# Patient Record
Sex: Female | Born: 1950 | Race: White | Hispanic: No | Smoking: Never smoker
Health system: Southern US, Community
[De-identification: ages and names within clinical notes are randomized; demographics above are authoritative.]

## PROBLEM LIST (undated history)

## (undated) DIAGNOSIS — D492 Neoplasm of unspecified behavior of bone, soft tissue, and skin: Secondary | ICD-10-CM

## (undated) DIAGNOSIS — C4499 Other specified malignant neoplasm of skin, unspecified: Secondary | ICD-10-CM

## (undated) DIAGNOSIS — B029 Zoster without complications: Secondary | ICD-10-CM

## (undated) DIAGNOSIS — D249 Benign neoplasm of unspecified breast: Secondary | ICD-10-CM

## (undated) DIAGNOSIS — F419 Anxiety disorder, unspecified: Secondary | ICD-10-CM

## (undated) DIAGNOSIS — R200 Anesthesia of skin: Secondary | ICD-10-CM

## (undated) DIAGNOSIS — K5790 Diverticulosis of intestine, part unspecified, without perforation or abscess without bleeding: Secondary | ICD-10-CM

## (undated) DIAGNOSIS — I10 Essential (primary) hypertension: Secondary | ICD-10-CM

## (undated) HISTORY — PX: HERNIA REPAIR: SHX51

## (undated) HISTORY — DX: Zoster without complications: B02.9

## (undated) HISTORY — PX: ABDOMINAL HYSTERECTOMY: SHX81

## (undated) HISTORY — PX: TUBAL LIGATION: SHX77

## (undated) HISTORY — DX: Benign neoplasm of unspecified breast: D24.9

## (undated) HISTORY — DX: Diverticulosis of intestine, part unspecified, without perforation or abscess without bleeding: K57.90

---
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
## 1998-11-17 ENCOUNTER — Other Ambulatory Visit: Admission: RE | Admit: 1998-11-17 | Discharge: 1998-11-17 | Payer: Self-pay | Admitting: Obstetrics & Gynecology

## 1999-12-14 ENCOUNTER — Other Ambulatory Visit: Admission: RE | Admit: 1999-12-14 | Discharge: 1999-12-14 | Payer: Self-pay | Admitting: Obstetrics & Gynecology

## 2000-08-01 ENCOUNTER — Other Ambulatory Visit: Admission: RE | Admit: 2000-08-01 | Discharge: 2000-08-01 | Payer: Self-pay | Admitting: Obstetrics & Gynecology

## 2001-02-22 ENCOUNTER — Other Ambulatory Visit: Admission: RE | Admit: 2001-02-22 | Discharge: 2001-02-22 | Payer: Self-pay | Admitting: Obstetrics & Gynecology

## 2002-03-21 ENCOUNTER — Other Ambulatory Visit: Admission: RE | Admit: 2002-03-21 | Discharge: 2002-03-21 | Payer: Self-pay | Admitting: Obstetrics & Gynecology

## 2002-08-15 HISTORY — PX: BREAST CYST ASPIRATION: SHX578

## 2003-04-03 ENCOUNTER — Other Ambulatory Visit: Admission: RE | Admit: 2003-04-03 | Discharge: 2003-04-03 | Payer: Self-pay | Admitting: Obstetrics & Gynecology

## 2004-07-22 ENCOUNTER — Other Ambulatory Visit: Admission: RE | Admit: 2004-07-22 | Discharge: 2004-07-22 | Payer: Self-pay | Admitting: Obstetrics & Gynecology

## 2005-04-14 ENCOUNTER — Observation Stay (HOSPITAL_COMMUNITY): Admission: RE | Admit: 2005-04-14 | Discharge: 2005-04-15 | Payer: Self-pay | Admitting: Obstetrics & Gynecology

## 2010-12-15 ENCOUNTER — Encounter (INDEPENDENT_AMBULATORY_CARE_PROVIDER_SITE_OTHER): Payer: Self-pay | Admitting: Internal Medicine

## 2011-04-21 ENCOUNTER — Encounter (INDEPENDENT_AMBULATORY_CARE_PROVIDER_SITE_OTHER): Payer: Self-pay | Admitting: Internal Medicine

## 2011-06-21 ENCOUNTER — Other Ambulatory Visit (INDEPENDENT_AMBULATORY_CARE_PROVIDER_SITE_OTHER): Payer: Self-pay | Admitting: *Deleted

## 2011-06-21 DIAGNOSIS — Z800 Family history of malignant neoplasm of digestive organs: Secondary | ICD-10-CM

## 2011-07-13 ENCOUNTER — Telehealth (INDEPENDENT_AMBULATORY_CARE_PROVIDER_SITE_OTHER): Payer: Self-pay | Admitting: *Deleted

## 2011-07-13 NOTE — Telephone Encounter (Signed)
PCP/Requesting MD:  BUTLER  Name: VICTORIAH WILDS  DOB: 08/13/1951  Home Phone: [phone]      Procedure: TCS  Reason/Indication:  SURVEILLANCE, + FH CRC  Has patient had this procedure before?  YES  If so, when, by whom and where?  6 YRS AGO, [facility]  Is there a family history of colon cancer?  YES  Who?  What age when diagnosed?  FATHER, AGE 58  Is patient diabetic?   NO      Does patient have prosthetic heart valve?  NO  Do you have a pacemaker?  NO  Has patient had joint replacement within last 12 months?  NO  Is patient on Coumadin, Plavix and/or Aspirin? NO  Medications: LISINOPRIL 40 MG DAILY, VITAMINS, CALCIUM, VIVELLE HORMONE PATCH  Allergies: KLFEX  Pharmacy:   Medication Adjustment: NONE  Procedure date & time: 07/21/11 @ 10:30

## 2011-07-13 NOTE — Telephone Encounter (Signed)
Agree with colonoscopy.

## 2011-07-20 MED ORDER — SODIUM CHLORIDE 0.45 % IV SOLN
Freq: Once | INTRAVENOUS | Status: AC
  Administered 2011-07-21: 09:00:00 via INTRAVENOUS

## 2011-07-21 ENCOUNTER — Other Ambulatory Visit (INDEPENDENT_AMBULATORY_CARE_PROVIDER_SITE_OTHER): Payer: Self-pay | Admitting: Internal Medicine

## 2011-07-21 ENCOUNTER — Encounter (HOSPITAL_COMMUNITY)
Admission: RE | Disposition: A | Discharge status: Home / Self Care | Payer: Self-pay | Source: Ambulatory Visit | Attending: Internal Medicine

## 2011-07-21 ENCOUNTER — Encounter (HOSPITAL_COMMUNITY): Payer: Self-pay | Admitting: *Deleted

## 2011-07-21 ENCOUNTER — Ambulatory Visit (HOSPITAL_COMMUNITY)
Admission: RE | Admit: 2011-07-21 | Discharge: 2011-07-21 | Disposition: A | Discharge status: Home / Self Care | Payer: No Typology Code available for payment source | Source: Ambulatory Visit | Attending: Internal Medicine | Admitting: Internal Medicine

## 2011-07-21 DIAGNOSIS — K5730 Diverticulosis of large intestine without perforation or abscess without bleeding: Secondary | ICD-10-CM

## 2011-07-21 DIAGNOSIS — D128 Benign neoplasm of rectum: Secondary | ICD-10-CM | POA: Insufficient documentation

## 2011-07-21 DIAGNOSIS — Z79899 Other long term (current) drug therapy: Secondary | ICD-10-CM | POA: Insufficient documentation

## 2011-07-21 DIAGNOSIS — Z1211 Encounter for screening for malignant neoplasm of colon: Secondary | ICD-10-CM | POA: Insufficient documentation

## 2011-07-21 DIAGNOSIS — Z800 Family history of malignant neoplasm of digestive organs: Secondary | ICD-10-CM

## 2011-07-21 DIAGNOSIS — D126 Benign neoplasm of colon, unspecified: Secondary | ICD-10-CM | POA: Insufficient documentation

## 2011-07-21 DIAGNOSIS — I10 Essential (primary) hypertension: Secondary | ICD-10-CM | POA: Insufficient documentation

## 2011-07-21 DIAGNOSIS — D129 Benign neoplasm of anus and anal canal: Secondary | ICD-10-CM

## 2011-07-21 HISTORY — PX: COLONOSCOPY: SHX5424

## 2011-07-21 HISTORY — DX: Essential (primary) hypertension: I10

## 2011-07-21 HISTORY — DX: Anxiety disorder, unspecified: F41.9

## 2011-07-21 HISTORY — DX: Anesthesia of skin: R20.0

## 2011-07-21 SURGERY — COLONOSCOPY
Anesthesia: Moderate Sedation

## 2011-07-21 MED ORDER — STERILE WATER FOR IRRIGATION IR SOLN
Status: DC | PRN
  Administered 2011-07-21: 09:00:00

## 2011-07-21 MED ORDER — MEPERIDINE HCL 50 MG/ML IJ SOLN
INTRAMUSCULAR | Status: AC
  Filled 2011-07-21: qty 1

## 2011-07-21 MED ORDER — MEPERIDINE HCL 50 MG/ML IJ SOLN
INTRAMUSCULAR | Status: DC | PRN
  Administered 2011-07-21 (×2): 25 mg via INTRAVENOUS

## 2011-07-21 MED ORDER — MIDAZOLAM HCL 5 MG/5ML IJ SOLN
INTRAMUSCULAR | Status: AC
  Filled 2011-07-21: qty 10

## 2011-07-21 MED ORDER — MIDAZOLAM HCL 5 MG/5ML IJ SOLN
INTRAMUSCULAR | Status: DC | PRN
  Administered 2011-07-21: 1 mg via INTRAVENOUS
  Administered 2011-07-21 (×2): 2 mg via INTRAVENOUS

## 2011-07-21 NOTE — Op Note (Signed)
COLONOSCOPY PROCEDURE REPORT  PATIENT:  Heidi Leblanc  MR#:  [id number] Birthdate:  [birth year], 60 y.o., female Endoscopist:  Dr. Malissa Hippo, MD Referred By:  Dr. Samuel Jester, DO Procedure Date: 07/21/2011  Procedure:   Colonoscopy  Indications:  Patient is 60 year old Caucasian female who is undergoing high-risk screening colonoscopy. Her last exam was about 6 years ago. Her father died of colon carcinoma within few months of diagnosis at age 54. There has been treated for breast CA  Informed Consent:  Procedure and risks were reviewed with the patient and informed consent was obtained. Medications:  Demerol 50 mg IV Versed 5 mg IV  Description of procedure:  After a digital rectal exam was performed, that colonoscope was advanced from the anus through the rectum and colon to the area of the cecum, ileocecal valve and appendiceal orifice. The cecum was deeply intubated. These structures were well-seen and photographed for the record. From the level of the cecum and ileocecal valve, the scope was slowly and cautiously withdrawn. The mucosal surfaces were carefully surveyed utilizing scope tip to flexion to facilitate fold flattening as needed. The scope was pulled down into the rectum where a thorough exam including retroflexion was performed.  Findings:   Prep satisfactory. Few diverticula at sigmoid colon. Two small polyps ablated via cold biopsy from rectosigmoid junction and two more from distal rectum. Anorectal junction was unremarkable.  Therapeutic/Diagnostic Maneuvers Performed:  See above.  Complications:  None.  Cecal Withdrawal Time:  15 minutes  Impression:  Examination performed to cecum. Few small diverticula at sigmoid colon. Four small polyps ablated via cold biopsy; two from rectosigmoid junction and two from distal rectum and submitted in two containers.  Recommendations:  Standard instructions given. I will be contacting patient with results of biopsy and  further recommendations.  Jametta Moorehead U  07/21/2011 9:55 AM  CC: Dr. Samuel Jester, DO, DO & Dr. No ref. provider found

## 2011-07-21 NOTE — H&P (Signed)
Heidi Leblanc is an 60 y.o. female.   Chief Complaint: Patient is here for colonoscopy. HPI: Patient is 60 year old Caucasian female who is here for screening colonoscopy. She's had 2 colonoscopies previously and these were negative for the last one was over 6 years ago. He denies abdominal pain, recent change in her bowel habits or rectal bleeding. Family history significant for colon carcinoma and a father age 54.within few months of diagnosis of advanced disease. Her mother has been treated for breast carcinoma  Past Medical History  Diagnosis Date  . Anxiety   . Hypertension   . Right arm numbness     Past Surgical History  Procedure Date  . Abdominal hysterectomy   . Tubal ligation   . Hernia repair     Family History  Problem Relation Age of Onset  . Colon cancer Father    Social History:  reports that she has quit smoking. She does not have any smokeless tobacco history on file. She reports that she drinks about 3.6 ounces of alcohol per week. She reports that she does not use illicit drugs.  Allergies:  Allergies  Allergen Reactions  . Keflex Hives    Medications Prior to Admission  Medication Dose Route Frequency Provider Last Rate Last Dose  . 0.45 % sodium chloride infusion   Intravenous Once Malissa Hippo, MD 20 mL/hr at 07/21/11 0856     Medications Prior to Admission  Medication Sig Dispense Refill  . ALPRAZolam (XANAX) 0.25 MG tablet Take 0.25 mg by mouth 3 (three) times daily as needed.        Marland Kitchen estradiol (VIVELLE-DOT) 0.05 MG/24HR Place 1 patch onto the skin once a week.        Marland Kitchen lisinopril (PRINIVIL,ZESTRIL) 40 MG tablet Take 40 mg by mouth daily.          No results found for this or any previous visit (from the past 48 hour(s)). No results found.  Review of Systems  Constitutional: Negative for weight loss.  Gastrointestinal: Negative for abdominal pain, diarrhea, constipation, blood in stool and melena.    Blood pressure 115/72, pulse 66,  temperature 97.8 F (36.6 C), temperature source Oral, resp. rate 16, height 5\' 4"  (1.626 m), weight 135 lb (61.236 kg), SpO2 98.00%. Physical Exam  Constitutional: She appears well-developed and well-nourished.  HENT:  Mouth/Throat: Oropharynx is clear and moist.  Eyes: Conjunctivae are normal. No scleral icterus.  Neck: No thyromegaly present.  Cardiovascular: Normal rate, regular rhythm and normal heart sounds.   Respiratory: Effort normal and breath sounds normal.  GI: Soft. She exhibits no distension and no mass. There is no tenderness.  Musculoskeletal: She exhibits no edema.  Lymphadenopathy:    She has no cervical adenopathy.  Neurological: She is alert.  Skin: Skin is warm and dry.     Assessment/Plan High-risk screening colonoscopy.  Heidi,NAJEEB Leblanc 07/21/2011, 9:23 AM

## 2011-08-02 ENCOUNTER — Encounter (HOSPITAL_COMMUNITY): Payer: Self-pay | Admitting: Internal Medicine

## 2011-08-17 ENCOUNTER — Encounter (INDEPENDENT_AMBULATORY_CARE_PROVIDER_SITE_OTHER): Payer: Self-pay | Admitting: *Deleted

## 2012-10-27 ENCOUNTER — Other Ambulatory Visit: Payer: Self-pay | Admitting: *Deleted

## 2012-10-27 DIAGNOSIS — Z780 Asymptomatic menopausal state: Secondary | ICD-10-CM

## 2012-10-31 ENCOUNTER — Telehealth: Payer: Self-pay

## 2012-10-31 NOTE — Telephone Encounter (Signed)
Patient would like Dr. Nash Dimmer nurse to call her

## 2012-10-31 NOTE — Telephone Encounter (Signed)
Patient stated that she has been having some pretty severe abdominal pain and spasms since Monday on and off. She would like Gina to give her a call back. I advised that they would not be back until tomorrow and she said she was ok to wait.

## 2012-11-01 NOTE — Telephone Encounter (Signed)
States this pressure  occurs frequently . Have hd hystrectomy hx of diverticulosis . Area of pressure in center and when occurs its makes it difficult to walk. No indigestion. C/o bloated  . Normal bm's and normal urination GYN is Dr Jennette Kettle last ov aug 2013  Normal pap.  Allergic to keflex Uses [facility] [employer]

## 2012-11-01 NOTE — Telephone Encounter (Signed)
Needs office visit. Can Mae or MMM see her tomorrow.

## 2012-11-02 ENCOUNTER — Telehealth: Payer: Self-pay | Admitting: Family Medicine

## 2012-11-02 NOTE — Telephone Encounter (Signed)
DUPLICATE MESSAGE  SEE MESSAGE

## 2012-11-02 NOTE — Telephone Encounter (Signed)
Pt stated a lot better and appt next week

## 2012-11-02 NOTE — Telephone Encounter (Signed)
Spoke with Almira Coaster yesterday about her situation. She is returning your call.

## 2012-11-02 NOTE — Telephone Encounter (Signed)
First available with dr Modesto Charon

## 2012-11-07 ENCOUNTER — Encounter: Payer: Self-pay | Admitting: Family Medicine

## 2012-11-08 ENCOUNTER — Ambulatory Visit: Payer: Self-pay | Admitting: Family Medicine

## 2012-12-05 ENCOUNTER — Other Ambulatory Visit: Payer: Self-pay

## 2012-12-05 ENCOUNTER — Encounter: Payer: Self-pay | Admitting: *Deleted

## 2012-12-05 ENCOUNTER — Ambulatory Visit: Payer: Self-pay

## 2013-02-11 ENCOUNTER — Ambulatory Visit: Payer: Self-pay | Admitting: Family Medicine

## 2013-02-18 ENCOUNTER — Ambulatory Visit: Payer: Self-pay | Admitting: Family Medicine

## 2013-02-28 ENCOUNTER — Ambulatory Visit (INDEPENDENT_AMBULATORY_CARE_PROVIDER_SITE_OTHER): Payer: BC Managed Care – PPO | Admitting: Family Medicine

## 2013-02-28 ENCOUNTER — Telehealth: Payer: Self-pay | Admitting: Family Medicine

## 2013-02-28 ENCOUNTER — Encounter: Payer: Self-pay | Admitting: Family Medicine

## 2013-02-28 VITALS — BP 97/71 | HR 78 | Temp 101.2°F | Wt 129.6 lb

## 2013-02-28 DIAGNOSIS — R7989 Other specified abnormal findings of blood chemistry: Secondary | ICD-10-CM

## 2013-02-28 DIAGNOSIS — R509 Fever, unspecified: Secondary | ICD-10-CM

## 2013-02-28 DIAGNOSIS — R110 Nausea: Secondary | ICD-10-CM

## 2013-02-28 LAB — POCT URINALYSIS DIPSTICK
Bilirubin, UA: NEGATIVE
Glucose, UA: NEGATIVE
Ketones, UA: NEGATIVE
Leukocytes, UA: NEGATIVE
Nitrite, UA: NEGATIVE
Spec Grav, UA: 1.01
Urobilinogen, UA: NEGATIVE
pH, UA: 7.5

## 2013-02-28 LAB — POCT UA - MICROSCOPIC ONLY
Casts, Ur, LPF, POC: NEGATIVE
Crystals, Ur, HPF, POC: NEGATIVE
Yeast, UA: NEGATIVE

## 2013-02-28 LAB — BASIC METABOLIC PANEL WITH GFR
BUN: 7 mg/dL (ref 6–23)
CO2: 25 mEq/L (ref 19–32)
Calcium: 9.4 mg/dL (ref 8.4–10.5)
Chloride: 98 mEq/L (ref 96–112)
Creat: 0.98 mg/dL (ref 0.50–1.10)
GFR, Est African American: 73 mL/min
GFR, Est Non African American: 63 mL/min
Glucose, Bld: 121 mg/dL — ABNORMAL HIGH (ref 70–99)
Potassium: 4.2 mEq/L (ref 3.5–5.3)
Sodium: 133 mEq/L — ABNORMAL LOW (ref 135–145)

## 2013-02-28 LAB — POCT CBC
Granulocyte percent: 82.4 %G — AB (ref 37–80)
HCT, POC: 42.4 % (ref 37.7–47.9)
Hemoglobin: 15.2 g/dL (ref 12.2–16.2)
Lymph, poc: 0.5 — AB (ref 0.6–3.4)
MCH, POC: 32 pg — AB (ref 27–31.2)
MCHC: 35.8 g/dL — AB (ref 31.8–35.4)
MCV: 89.3 fL (ref 80–97)
MPV: 6.9 fL (ref 0–99.8)
POC Granulocyte: 3.3 (ref 2–6.9)
POC LYMPH PERCENT: 12.2 %L (ref 10–50)
Platelet Count, POC: 184 10*3/uL (ref 142–424)
RBC: 4.7 M/uL (ref 4.04–5.48)
RDW, POC: 12.2 %
WBC: 4 10*3/uL — AB (ref 4.6–10.2)

## 2013-02-28 MED ORDER — DOXYCYCLINE HYCLATE 100 MG PO TABS: 100.0000 mg | ORAL_TABLET | Freq: Two times a day (BID) | ORAL | Status: DC

## 2013-02-28 MED ORDER — ONDANSETRON 8 MG PO TBDP: 8.0000 mg | ORAL_TABLET | Freq: Three times a day (TID) | ORAL | Status: DC | PRN

## 2013-02-28 NOTE — Progress Notes (Signed)
  Subjective:    Patient ID: Heidi Leblanc, female    DOB: 08-Oct-1950, 62 y.o.   MRN: [id number]  HPI This 62 y.o. female presents for evaluation of fever for a day and nausea. She states she was bitten by possibly a tick a few weeks ago and she  Did get a rash and she states it is gone.  She states she feels weak and Tired.  She is concerned about being bitten by a tick.  She denies any UA or URI Sx's.   Review of Systems C/o fever and nausea No chest pain, SOB, HA, dizziness, vision change, diarrhea, constipation, dysuria, urinary urgency or frequency, myalgias, arthralgias or rash.     Objective:   Physical Exam Vital signs noted  Well developed well nourished female.  HEENT - Head atraumatic Normocephalic                Eyes - PERRLA, Conjuctiva - clear Sclera- Clear EOMI                Ears - EAC's Wnl TM's Wnl Gross Hearing WNL                Nose - Nares patent                 Throat - oropharanx wnl Respiratory - Lungs CTA bilateral Cardiac - RRR S1 and S2 without murmur GI - Abdomen soft Nontender and bowel sounds active x 4       Assessment & Plan:  Fever, unspecified - Plan: doxycycline (VIBRA-TABS) 100 MG tablet, POCT CBC, [place] spotted fvr abs pnl(IgG+IgM), BASIC METABOLIC PANEL WITH GFR, POCT urinalysis dipstick, POCT UA - Microscopic Only.  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  She is encouraged to follow up prn if not feeling better or if fever continues.  Tylenol and motrin otc prn for fever.  Nausea alone - Plan: ondansetron (ZOFRAN ODT) 8 MG disintegrating tablet

## 2013-02-28 NOTE — Addendum Note (Signed)
Addended by: Prescott Gum on: 02/28/2013 10:56 AM   Modules accepted: Orders

## 2013-02-28 NOTE — Patient Instructions (Signed)
Fever   Fever is a higher-than-normal body temperature. A normal temperature varies with:   Age.   How it is measured (mouth, underarm, rectal, or ear).   Time of day.  In an adult, an oral temperature around 98.6 Fahrenheit (F) or 37 Celsius (C) is considered normal. A rise in temperature of about 1.8 F or 1 C is generally considered a fever (100.4 F or 38 C). In an infant age 60 days or less, a rectal temperature of 100.4 F (38 C) generally is regarded as fever. Fever is not a disease but can be a symptom of illness.  CAUSES    Fever is most commonly caused by infection.   Some non-infectious problems can cause fever. For example:   Some arthritis problems.   Problems with the thyroid or adrenal glands.   Immune system problems.   Some kinds of cancer.   A reaction to certain medicines.   Occasionally, the source of a fever cannot be determined. This is sometimes called a "Fever of Unknown Origin" (FUO).   Some situations may lead to a temporary rise in body temperature that may go away on its own. Examples are:   Childbirth.   Surgery.   Some situations may cause a rise in body temperature but these are not considered "true fever". Examples are:   Intense exercise.   Dehydration.   Exposure to high outside or room temperatures.  SYMPTOMS    Feeling warm or hot.   Fatigue or feeling exhausted.   Aching all over.   Chills.   Shivering.   Sweats.  DIAGNOSIS   A fever can be suspected by your caregiver feeling that your skin is unusually warm. The fever is confirmed by taking a temperature with a thermometer. Temperatures can be taken different ways. Some methods are accurate and some are not:  With adults, adolescents, and children:    An oral temperature is used most commonly.   An ear thermometer will only be accurate if it is positioned as recommended by the manufacturer.   Under the arm temperatures are not accurate and not recommended.   Most electronic thermometers are fast  and accurate.  Infants and Toddlers:   Rectal temperatures are recommended and most accurate.   Ear temperatures are not accurate in this age group and are not recommended.   Skin thermometers are not accurate.  RISKS AND COMPLICATIONS    During a fever, the body uses more oxygen, so a person with a fever may develop rapid breathing or shortness of breath. This can be dangerous especially in people with heart or lung disease.   The sweats that occur following a fever can cause dehydration.   High fever can cause seizures in infants and children.   Older persons can develop confusion during a fever.  TREATMENT    Medications may be used to control temperature.   Do not give aspirin to children with fevers. There is an association with Reye's syndrome. Reye's syndrome is a rare but potentially deadly disease.   If an infection is present and medications have been prescribed, take them as directed. Finish the full course of medications until they are gone.   Sponging or bathing with room-temperature water may help reduce body temperature. Do not use ice water or alcohol sponge baths.   Do not over-bundle children in blankets or heavy clothes.   Drinking adequate fluids during an illness with fever is important to prevent dehydration.  HOME CARE INSTRUCTIONS      For adults, rest and adequate fluid intake are important. Dress according to how you feel, but do not over-bundle.   Drink enough water and/or fluids to keep your urine clear or pale yellow.   For infants over 3 months and children, giving medication as directed by your caregiver to control fever can help with comfort. The amount to be given is based on the child's weight. Do NOT give more than is recommended.  SEEK MEDICAL CARE IF:    You or your child are unable to keep fluids down.   Vomiting or diarrhea develops.   You develop a skin rash.   An oral temperature above 102 F (38.9 C) develops, or a fever which persists for over 3  days.   You develop excessive weakness, dizziness, fainting or extreme thirst.   Fevers keep coming back after 3 days.  SEEK IMMEDIATE MEDICAL CARE IF:    Shortness of breath or trouble breathing develops   You pass out.   You feel you are making little or no urine.   New pain develops that was not there before (such as in the head, neck, chest, back, or abdomen).   You cannot hold down fluids.   Vomiting and diarrhea persist for more than a day or two.   You develop a stiff neck and/or your eyes become sensitive to light.   An unexplained temperature above 102 F (38.9 C) develops.  Document Released: 08/01/2005 Document Revised: 10/24/2011 Document Reviewed: 07/17/2008  Exit[employer] Patient Information 2014 [employer].

## 2013-02-28 NOTE — Telephone Encounter (Signed)
appt given for 10:30 today with Ander Slade.

## 2013-03-01 ENCOUNTER — Telehealth: Payer: Self-pay | Admitting: Family Medicine

## 2013-03-01 LAB — B. BURGDORFI ANTIBODIES: B burgdorferi Ab IgG+IgM: 0.49 {ISR}

## 2013-03-01 LAB — ROCKY MTN SPOTTED FVR ABS PNL(IGG+IGM)
RMSF IgG: 0.14 IV
RMSF IgM: 0.11 IV

## 2013-03-01 NOTE — Telephone Encounter (Signed)
She said she had a call this morning from Dr. Charlynne Pander nurse about Lab, she thinks

## 2013-03-01 NOTE — Telephone Encounter (Signed)
PATIENT SAYS SOMEONE CALLED HER. SHE IS RETURNING THEIR CALL

## 2013-03-01 NOTE — Telephone Encounter (Signed)
Attempted to call patient numerous times. Still with no answer. Will continue to try and reach patient

## 2013-03-04 ENCOUNTER — Telehealth: Payer: Self-pay | Admitting: Family Medicine

## 2013-03-04 NOTE — Telephone Encounter (Signed)
Patient aware.

## 2013-03-07 ENCOUNTER — Encounter: Payer: Self-pay | Admitting: Family Medicine

## 2013-03-07 ENCOUNTER — Ambulatory Visit (INDEPENDENT_AMBULATORY_CARE_PROVIDER_SITE_OTHER): Payer: BC Managed Care – PPO | Admitting: Family Medicine

## 2013-03-07 VITALS — BP 142/86 | HR 58 | Temp 97.0°F | Ht 63.0 in | Wt 128.0 lb

## 2013-03-07 DIAGNOSIS — I10 Essential (primary) hypertension: Secondary | ICD-10-CM

## 2013-03-07 DIAGNOSIS — R319 Hematuria, unspecified: Secondary | ICD-10-CM

## 2013-03-07 DIAGNOSIS — F411 Generalized anxiety disorder: Secondary | ICD-10-CM

## 2013-03-07 DIAGNOSIS — R300 Dysuria: Secondary | ICD-10-CM

## 2013-03-07 LAB — POCT URINALYSIS DIPSTICK
Bilirubin, UA: NEGATIVE
Glucose, UA: NEGATIVE
Ketones, UA: NEGATIVE
Leukocytes, UA: NEGATIVE
Nitrite, UA: NEGATIVE
Protein, UA: NEGATIVE
Spec Grav, UA: <=1.005
Urobilinogen, UA: NEGATIVE
pH, UA: 7.5

## 2013-03-07 LAB — POCT UA - MICROSCOPIC ONLY
Bacteria, U Microscopic: NEGATIVE
Casts, Ur, LPF, POC: NEGATIVE
Crystals, Ur, HPF, POC: NEGATIVE
Mucus, UA: NEGATIVE
WBC, Ur, HPF, POC: NEGATIVE
Yeast, UA: NEGATIVE

## 2013-03-07 MED ORDER — ALPRAZOLAM 0.25 MG PO TABS: 0.2500 mg | ORAL_TABLET | Freq: Three times a day (TID) | ORAL | Status: DC | PRN

## 2013-03-07 NOTE — Progress Notes (Signed)
  Subjective:    Patient ID: Heidi Leblanc, female    DOB: 14-Aug-1951, 62 y.o.   MRN: [id number]  HPI  This 62 y.o. female presents for evaluation of dark urine and follow up on her last visit Where she had rash and fever.  She states she is feeling better.  She has had recent Labs which show trace of blood in her urine.  She has mild hyponatremia and nonfasting Glucose was 122.  She denies any fever at this time.  Review of Systems C/o concentrated UA.   No chest pain, SOB, HA, dizziness, vision change, N/V, diarrhea, constipation, dysuria, urinary urgency or frequency, myalgias, arthralgias or rash.   Objective:   Physical Exam Vital signs noted  Well developed well nourished female.  HEENT - Head atraumatic Normocephalic                Eyes - PERRLA, Conjuctiva - clear Sclera- Clear EOMI                Ears - EAC's Wnl TM's Wnl Gross Hearing WNL                Nose - Nares patent                 Throat - oropharanx wnl Respiratory - Lungs CTA bilateral Cardiac - RRR S1 and S2 without murmur GI - Abdomen soft Nontender and bowel sounds active x 4 Extremities - No edema. Neuro - Grossly intact.       Assessment & Plan:  Burning with urination - Plan: POCT UA - Microscopic Only, POCT urinalysis dipstick, Urine culture She actually is not having any UA sx's and she feels better since her last visit so recommend UA cx and push po fluids And repeat UA next visit.    Anxiety state, unspecified - Plan: ALPRAZolam (XANAX) 0.25 MG tablet  Hematuria - Send UA cx and if positive will tx.  Repeat UA next visit and if continues to have blood may need Urology referral  Essential hypertension, benign - Controlled and continue current regimen.  Follow up in 6 months.

## 2013-03-07 NOTE — Patient Instructions (Signed)

## 2013-03-08 LAB — URINE CULTURE
Colony Count: NO GROWTH
Organism ID, Bacteria: NO GROWTH

## 2013-03-14 ENCOUNTER — Telehealth: Payer: Self-pay | Admitting: Family Medicine

## 2013-03-14 NOTE — Telephone Encounter (Signed)
Patient aware.

## 2013-03-18 NOTE — Addendum Note (Signed)
Addended by: Orma Render F on: 03/18/2013 05:08 PM   Modules accepted: Orders

## 2013-03-18 NOTE — Addendum Note (Signed)
Addended by: Orma Render F on: 03/18/2013 05:06 PM   Modules accepted: Orders

## 2013-03-19 ENCOUNTER — Encounter: Payer: Self-pay | Admitting: *Deleted

## 2013-03-21 ENCOUNTER — Other Ambulatory Visit (INDEPENDENT_AMBULATORY_CARE_PROVIDER_SITE_OTHER): Payer: BC Managed Care – PPO

## 2013-03-21 DIAGNOSIS — R7309 Other abnormal glucose: Secondary | ICD-10-CM

## 2013-03-21 DIAGNOSIS — R7989 Other specified abnormal findings of blood chemistry: Secondary | ICD-10-CM

## 2013-03-21 LAB — POCT GLYCOSYLATED HEMOGLOBIN (HGB A1C): Hemoglobin A1C: 5.3

## 2013-03-22 NOTE — Progress Notes (Signed)
Patient came in for labs only.

## 2013-09-09 ENCOUNTER — Ambulatory Visit: Payer: BC Managed Care – PPO | Admitting: Family Medicine

## 2016-08-19 ENCOUNTER — Encounter (INDEPENDENT_AMBULATORY_CARE_PROVIDER_SITE_OTHER): Payer: Self-pay | Admitting: *Deleted

## 2017-04-11 ENCOUNTER — Other Ambulatory Visit: Payer: Self-pay | Admitting: Dermatology

## 2017-04-11 DIAGNOSIS — D492 Neoplasm of unspecified behavior of bone, soft tissue, and skin: Secondary | ICD-10-CM

## 2017-04-11 HISTORY — DX: Neoplasm of unspecified behavior of bone, soft tissue, and skin: D49.2

## 2017-05-16 ENCOUNTER — Other Ambulatory Visit: Payer: Self-pay | Admitting: Dermatology

## 2017-05-16 DIAGNOSIS — C4499 Other specified malignant neoplasm of skin, unspecified: Secondary | ICD-10-CM

## 2017-05-16 HISTORY — DX: Other specified malignant neoplasm of skin, unspecified: C44.99

## 2018-07-03 ENCOUNTER — Encounter (INDEPENDENT_AMBULATORY_CARE_PROVIDER_SITE_OTHER): Payer: Self-pay | Admitting: *Deleted

## 2018-08-01 ENCOUNTER — Other Ambulatory Visit (INDEPENDENT_AMBULATORY_CARE_PROVIDER_SITE_OTHER): Payer: Self-pay | Admitting: *Deleted

## 2018-08-01 DIAGNOSIS — Z860100 Personal history of colon polyps, unspecified: Secondary | ICD-10-CM | POA: Insufficient documentation

## 2018-08-01 DIAGNOSIS — Z800 Family history of malignant neoplasm of digestive organs: Secondary | ICD-10-CM | POA: Insufficient documentation

## 2018-09-24 ENCOUNTER — Other Ambulatory Visit (HOSPITAL_COMMUNITY): Payer: Self-pay | Admitting: Internal Medicine

## 2018-09-24 DIAGNOSIS — Z780 Asymptomatic menopausal state: Secondary | ICD-10-CM

## 2018-10-04 ENCOUNTER — Other Ambulatory Visit (HOSPITAL_COMMUNITY): Payer: Medicare HMO

## 2018-10-09 ENCOUNTER — Ambulatory Visit (HOSPITAL_COMMUNITY)
Admission: RE | Admit: 2018-10-09 | Discharge: 2018-10-09 | Disposition: A | Discharge status: Home / Self Care | Payer: Medicare HMO | Source: Ambulatory Visit | Attending: Internal Medicine | Admitting: Internal Medicine

## 2018-10-09 DIAGNOSIS — Z780 Asymptomatic menopausal state: Secondary | ICD-10-CM | POA: Insufficient documentation

## 2018-10-16 ENCOUNTER — Telehealth (INDEPENDENT_AMBULATORY_CARE_PROVIDER_SITE_OTHER): Payer: Self-pay | Admitting: *Deleted

## 2018-10-16 ENCOUNTER — Encounter (INDEPENDENT_AMBULATORY_CARE_PROVIDER_SITE_OTHER): Payer: Self-pay | Admitting: *Deleted

## 2018-10-16 NOTE — Telephone Encounter (Signed)
Patient needs trilyte 

## 2018-10-17 MED ORDER — PEG 3350-KCL-NA BICARB-NACL 420 G PO SOLR: 4000.0000 mL | Freq: Once | ORAL | 0 refills | Status: AC

## 2018-11-08 ENCOUNTER — Telehealth (INDEPENDENT_AMBULATORY_CARE_PROVIDER_SITE_OTHER): Payer: Self-pay | Admitting: *Deleted

## 2018-11-08 NOTE — Telephone Encounter (Signed)
agree

## 2018-11-08 NOTE — Telephone Encounter (Signed)
Referring MD/PCP: gosrani   Procedure: tcs  Reason/Indication:  Hx polyps, fam hx colon ca  Has patient had this procedure before?  Yes, 2012  If so, when, by whom and where?    Is there a family history of colon cancer?  Yes, father  Who?  What age when diagnosed?    Is patient diabetic?   no      Does patient have prosthetic heart valve or mechanical valve?  no  Do you have a pacemaker?  no  Has patient ever had endocarditis? no  Has patient had joint replacement within last 12 months?  no  Is patient constipated or do they take laxatives? no  Does patient have a history of alcohol/drug use?  no  Is patient on blood thinner such as Coumadin, Plavix and/or Aspirin? no  Medications: amlodipine 5 mg daily, progesterone 200 mg bid, estradiol 1 mg 1/2 tab daily, testosterone cream  Allergies: see epic  Medication Adjustment per Dr Lindi Adie, NP:   Procedure date & time: 11/29/18 at 930

## 2019-01-09 ENCOUNTER — Ambulatory Visit (INDEPENDENT_AMBULATORY_CARE_PROVIDER_SITE_OTHER): Payer: Medicare HMO | Admitting: Internal Medicine

## 2019-02-18 ENCOUNTER — Encounter (INDEPENDENT_AMBULATORY_CARE_PROVIDER_SITE_OTHER): Payer: Self-pay | Admitting: *Deleted

## 2019-03-28 DIAGNOSIS — Z800 Family history of malignant neoplasm of digestive organs: Secondary | ICD-10-CM

## 2019-03-28 DIAGNOSIS — Z86010 Personal history of colonic polyps: Principal | ICD-10-CM

## 2019-04-16 ENCOUNTER — Telehealth (INDEPENDENT_AMBULATORY_CARE_PROVIDER_SITE_OTHER): Payer: Self-pay

## 2019-04-16 NOTE — Telephone Encounter (Signed)
Patient called and said she has white spots on her throat.  Please call her.

## 2019-04-16 NOTE — Telephone Encounter (Signed)
Add on schedule for 9/2 @ 1:20pm.

## 2019-04-17 ENCOUNTER — Other Ambulatory Visit: Payer: Self-pay

## 2019-04-17 ENCOUNTER — Encounter (INDEPENDENT_AMBULATORY_CARE_PROVIDER_SITE_OTHER): Payer: Self-pay | Admitting: *Deleted

## 2019-04-17 ENCOUNTER — Encounter (INDEPENDENT_AMBULATORY_CARE_PROVIDER_SITE_OTHER): Payer: Self-pay | Admitting: Internal Medicine

## 2019-04-17 ENCOUNTER — Ambulatory Visit (INDEPENDENT_AMBULATORY_CARE_PROVIDER_SITE_OTHER): Payer: Medicare HMO | Admitting: Internal Medicine

## 2019-04-17 VITALS — BP 130/78 | HR 72 | Ht 62.0 in | Wt 124.4 lb

## 2019-04-17 DIAGNOSIS — I10 Essential (primary) hypertension: Secondary | ICD-10-CM

## 2019-04-17 DIAGNOSIS — T7840XA Allergy, unspecified, initial encounter: Secondary | ICD-10-CM

## 2019-04-17 NOTE — Patient Instructions (Signed)
Take allergy medicine as discussed .

## 2019-04-17 NOTE — Progress Notes (Signed)
     Subjective:  Patient ID: Heidi Leblanc, female    DOB: 1951-03-17  Age: 68 y.o. MRN: [id number]  CC: This lady comes in for an acute visit with complaint of discoloration of the back of her throat.  HPI She has had the symptoms for the last couple of weeks and denies any sore throat, fever, nasal congestion.  She does suffer from allergies but she therefore wonders whether this is just postnasal drainage.  There is no cough or dyspnea.    Past Medical History:  Diagnosis Date  . Anxiety   . 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 Diverticulosis   . Hypertension   . Right arm numbness   . Shingles      Social History   Social History Narrative  . 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 Current Meds  Medication Sig  . amLODipine (NORVASC) 5 MG tablet Take 5 mg by mouth daily.  . Cholecalciferol (VITAMIN D-3) 125 MCG (5000 UT) TABS Take 1 tablet by mouth daily at 12 noon.  Marland Kitchen estradiol (ESTRACE) 0.5 MG tablet Take 0.5 mg by mouth daily.  . NP THYROID 15 MG tablet Take 15 mg by mouth daily.  . Omega-3-6-9 CAPS Take 1 capsule by mouth 3 (three) times daily.  . progesterone (PROMETRIUM) 200 MG capsule Take 1 capsule by mouth daily at 12 noon.  . Testosterone 20 % CREA Apply 5 mg topically daily at 12 noon. Apply to labia       Objective:   Today's Vitals: BP 130/78   Pulse 72   Ht 5\' 2"  (1.575 m)   Wt 124 lb 6.4 oz (56.4 kg)   BMI 22.75 kg/m  Vitals with BMI 04/17/2019 03/07/2013 02/28/2013  Height 5\' 2"  5\' 3"  -  Weight 124 lbs 6 oz 128 lbs 129 lbs 10 oz  BMI [id number] -  Systolic [id number]  Diastolic 78 86 71  Pulse 72 58 78       Physical Exam  She looks systemically well.  There is mild  erythema in the back of her throat but I cannot see any exudates.  Tonsils look normal.  Tongue looks normal.  There is no neck lymphadenopathy.  She is alert and orientated and nontoxic.  Assessment & Plan:    1. Allergic state, initial encounter   2. Essential hypertension, benign    1. I have reassured her that I do not think there is any infection at the back of her throat/pharyngitis.  She can use allergy medicine over-the-counter that she is doing at the present time, she seems to prefer Allegra. 2. Blood pressure is better controlled today than it was previously.  Continue with same medications. 3. Follow-up as scheduled.   Doree Albee, MD

## 2019-04-24 ENCOUNTER — Other Ambulatory Visit (INDEPENDENT_AMBULATORY_CARE_PROVIDER_SITE_OTHER): Payer: Self-pay | Admitting: Internal Medicine

## 2019-04-24 ENCOUNTER — Telehealth (INDEPENDENT_AMBULATORY_CARE_PROVIDER_SITE_OTHER): Payer: Self-pay | Admitting: Internal Medicine

## 2019-04-24 DIAGNOSIS — Z23 Encounter for immunization: Secondary | ICD-10-CM

## 2019-05-02 ENCOUNTER — Telehealth (INDEPENDENT_AMBULATORY_CARE_PROVIDER_SITE_OTHER): Payer: Self-pay | Admitting: *Deleted

## 2019-05-02 ENCOUNTER — Ambulatory Visit (INDEPENDENT_AMBULATORY_CARE_PROVIDER_SITE_OTHER): Payer: Self-pay

## 2019-05-02 ENCOUNTER — Other Ambulatory Visit: Payer: Self-pay

## 2019-05-02 NOTE — Telephone Encounter (Signed)
Referring MD/PCP: gosrani   Procedure: tcs  Reason/Indication:  Hx polyps, fam hx colon ca  Has patient had this procedure before?  Yes, 2012             If so, when, by whom and where?    Is there a family history of colon cancer?  Yes, father             Who?  What age when diagnosed?    Is patient diabetic?   no                                                  Does patient have prosthetic heart valve or mechanical valve?  no  Do you have a pacemaker?  no  Has patient ever had endocarditis? no  Has patient had joint replacement within last 12 months?  no  Is patient constipated or do they take laxatives? no  Does patient have a history of alcohol/drug use?  no  Is patient on blood thinner such as Coumadin, Plavix and/or Aspirin? no  Medications: amlodipine 5 mg daily, progesterone 200 mg bid, estradiol 1 mg 1/2 tab daily, testosterone cream  Allergies: see epic  Medication Adjustment per Dr Lindi Adie, NP:   Procedure date & time: 05/30/19 at 930

## 2019-05-12 NOTE — Telephone Encounter (Signed)
Colonoscopy with conscious sedation 

## 2019-05-27 ENCOUNTER — Ambulatory Visit (INDEPENDENT_AMBULATORY_CARE_PROVIDER_SITE_OTHER): Payer: Medicare HMO

## 2019-05-28 ENCOUNTER — Ambulatory Visit (INDEPENDENT_AMBULATORY_CARE_PROVIDER_SITE_OTHER): Payer: Medicare HMO

## 2019-05-28 ENCOUNTER — Other Ambulatory Visit: Payer: Self-pay

## 2019-05-28 ENCOUNTER — Other Ambulatory Visit (HOSPITAL_COMMUNITY)
Admission: RE | Admit: 2019-05-28 | Discharge: 2019-05-28 | Disposition: A | Discharge status: Home / Self Care | Payer: Medicare HMO | Source: Ambulatory Visit | Attending: Internal Medicine | Admitting: Internal Medicine

## 2019-05-28 DIAGNOSIS — Z01812 Encounter for preprocedural laboratory examination: Secondary | ICD-10-CM | POA: Diagnosis present

## 2019-05-28 DIAGNOSIS — Z20828 Contact with and (suspected) exposure to other viral communicable diseases: Secondary | ICD-10-CM | POA: Insufficient documentation

## 2019-05-28 DIAGNOSIS — Z23 Encounter for immunization: Secondary | ICD-10-CM | POA: Diagnosis not present

## 2019-05-28 LAB — SARS CORONAVIRUS 2 (TAT 6-24 HRS): SARS Coronavirus 2: NEGATIVE

## 2019-05-30 ENCOUNTER — Encounter (HOSPITAL_COMMUNITY)
Admission: RE | Disposition: A | Discharge status: Home / Self Care | Payer: Self-pay | Source: Home / Self Care | Attending: Internal Medicine

## 2019-05-30 ENCOUNTER — Other Ambulatory Visit: Payer: Self-pay

## 2019-05-30 ENCOUNTER — Encounter (HOSPITAL_COMMUNITY): Payer: Self-pay | Admitting: *Deleted

## 2019-05-30 ENCOUNTER — Ambulatory Visit (HOSPITAL_COMMUNITY)
Admission: RE | Admit: 2019-05-30 | Discharge: 2019-05-30 | Disposition: A | Discharge status: Home / Self Care | Payer: Medicare HMO | Attending: Internal Medicine | Admitting: Internal Medicine

## 2019-05-30 DIAGNOSIS — Z800 Family history of malignant neoplasm of digestive organs: Secondary | ICD-10-CM | POA: Insufficient documentation

## 2019-05-30 DIAGNOSIS — I10 Essential (primary) hypertension: Secondary | ICD-10-CM | POA: Insufficient documentation

## 2019-05-30 DIAGNOSIS — Z860100 Personal history of colon polyps, unspecified: Secondary | ICD-10-CM | POA: Insufficient documentation

## 2019-05-30 DIAGNOSIS — Z1211 Encounter for screening for malignant neoplasm of colon: Secondary | ICD-10-CM | POA: Insufficient documentation

## 2019-05-30 DIAGNOSIS — K644 Residual hemorrhoidal skin tags: Secondary | ICD-10-CM | POA: Diagnosis not present

## 2019-05-30 DIAGNOSIS — Z85828 Personal history of other malignant neoplasm of skin: Secondary | ICD-10-CM | POA: Insufficient documentation

## 2019-05-30 DIAGNOSIS — Z79890 Hormone replacement therapy (postmenopausal): Secondary | ICD-10-CM | POA: Diagnosis not present

## 2019-05-30 DIAGNOSIS — Z8719 Personal history of other diseases of the digestive system: Secondary | ICD-10-CM | POA: Insufficient documentation

## 2019-05-30 DIAGNOSIS — K5730 Diverticulosis of large intestine without perforation or abscess without bleeding: Secondary | ICD-10-CM

## 2019-05-30 DIAGNOSIS — Z79899 Other long term (current) drug therapy: Secondary | ICD-10-CM | POA: Insufficient documentation

## 2019-05-30 HISTORY — PX: COLONOSCOPY: SHX5424

## 2019-05-30 SURGERY — COLONOSCOPY
Anesthesia: Moderate Sedation

## 2019-05-30 MED ORDER — STERILE WATER FOR IRRIGATION IR SOLN
Status: DC | PRN
  Administered 2019-05-30: 2.5 mL

## 2019-05-30 MED ORDER — MIDAZOLAM HCL 5 MG/5ML IJ SOLN
INTRAMUSCULAR | Status: DC | PRN
  Administered 2019-05-30: 2 mg via INTRAVENOUS
  Administered 2019-05-30 (×2): 1 mg via INTRAVENOUS
  Administered 2019-05-30: 2 mg via INTRAVENOUS
  Administered 2019-05-30: 1 mg via INTRAVENOUS

## 2019-05-30 MED ORDER — MEPERIDINE HCL 50 MG/ML IJ SOLN
INTRAMUSCULAR | Status: DC | PRN
  Administered 2019-05-30 (×2): 25 mg via INTRAVENOUS

## 2019-05-30 MED ORDER — SODIUM CHLORIDE 0.9 % IV SOLN
INTRAVENOUS | Status: DC
  Administered 2019-05-30: 09:00:00 via INTRAVENOUS

## 2019-05-30 MED ORDER — MEPERIDINE HCL 50 MG/ML IJ SOLN
INTRAMUSCULAR | Status: AC
  Filled 2019-05-30: qty 1

## 2019-05-30 MED ORDER — MIDAZOLAM HCL 5 MG/5ML IJ SOLN
INTRAMUSCULAR | Status: AC
  Filled 2019-05-30: qty 10

## 2019-05-30 NOTE — Op Note (Signed)
[facility] Patient Name: Heidi Leblanc Procedure Date: 05/30/2019 9:10 AM MRN: [id number] Date of Birth: [birth year] Attending MD: Hildred Laser , MD CSN: [id number] Age: 68 Admit Type: Outpatient Procedure:                Colonoscopy Indications:              Screening for colorectal malignant neoplasm,                            Screening in patient at increased risk: Colorectal                            cancer in father before age 12 Providers:                Hildred Laser, MD, Otis Peak B. Sharon Seller, RN, Raphael Gibney, Technician Referring MD:             Jeralyn Ruths, NP Medicines:                Meperidine 50 mg IV, Midazolam 7 mg IV Complications:            No immediate complications. Estimated Blood Loss:     Estimated blood loss: none. Procedure:                Pre-Anesthesia Assessment:                           - Prior to the procedure, a History and Physical                            was performed, and patient medications and                            allergies were reviewed. The patient's tolerance of                            previous anesthesia was also reviewed. The risks                            and benefits of the procedure and the sedation                            options and risks were discussed with the patient.                            All questions were answered, and informed consent                            was obtained. Prior Anticoagulants: The patient has                            taken no previous anticoagulant or antiplatelet  agents. ASA Grade Assessment: II - A patient with                            mild systemic disease. After reviewing the risks                            and benefits, the patient was deemed in                            satisfactory condition to undergo the procedure.                           After obtaining informed consent, the colonoscope   was passed under direct vision. Throughout the                            procedure, the patient's blood pressure, pulse, and                            oxygen saturations were monitored continuously. The                            [id number] scope was introduced through                            the anus and advanced to the the cecum, identified                            by appendiceal orifice and ileocecal valve. The                            colonoscopy was performed without difficulty. The                            patient tolerated the procedure well. The quality                            of the bowel preparation was excellent. The                            ileocecal valve, appendiceal orifice, and rectum                            were photographed. Scope In: 9:26:40 AM Scope Out: 9:45:16 AM Scope Withdrawal Time: 0 hours 9 minutes 12 seconds  Total Procedure Duration: 0 hours 18 minutes 36 seconds  Findings:      The perianal and digital rectal examinations were normal.      Scattered diverticula were found in the sigmoid colon and transverse       colon.      External hemorrhoids were found during retroflexion. The hemorrhoids       were small. Impression:               - Diverticulosis in the sigmoid colon and in the  transverse colon.                           - External hemorrhoids.                           - No specimens collected. Moderate Sedation:      Moderate (conscious) sedation was administered by the endoscopy nurse       and supervised by the endoscopist. The following parameters were       monitored: oxygen saturation, heart rate, blood pressure, CO2       capnography and response to care. Total physician intraservice time was       23 minutes. Recommendation:           - Patient has a contact number available for                            emergencies. The signs and symptoms of potential                             delayed complications were discussed with the                            patient. Return to normal activities tomorrow.                            Written discharge instructions were provided to the                            patient.                           - High fiber diet today.                           - Continue present medications.                           - Repeat colonoscopy in 5 years for screening                            purposes. Procedure Code(s):        --- Professional ---                           [id number], Colonoscopy, flexible; diagnostic, including                            collection of specimen(s) by brushing or washing,                            when performed (separate procedure)                           99153, Moderate sedation; each additional 15  minutes intraservice time                           G0500, Moderate sedation services provided by the                            same physician or other qualified health care                            professional performing a gastrointestinal                            endoscopic service that sedation supports,                            requiring the presence of an independent trained                            observer to assist in the monitoring of the                            patient's level of consciousness and physiological                            status; initial 15 minutes of intra-service time;                            patient age 51 years or older (additional time may                            be reported with [phone], as appropriate) Diagnosis Code(s):        --- Professional ---                           Z12.11, Encounter for screening for malignant                            neoplasm of colon                           Z80.0, Family history of malignant neoplasm of                            digestive organs                           K64.4, Residual hemorrhoidal skin  tags                           K57.30, Diverticulosis of large intestine without                            perforation or abscess without bleeding CPT copyright 2019 American Medical Association. All rights reserved. The codes documented in this report are preliminary and upon coder review may  be revised to meet current compliance requirements.  Hildred Laser, MD Hildred Laser, MD 05/30/2019 9:52:49 AM This report has been signed electronically. Number of Addenda: 0

## 2019-05-30 NOTE — H&P (Signed)
Heidi Leblanc is an 68 y.o. female.   Chief Complaint: Patient is here for colonoscopy. HPI: Patient is 68 year old Caucasian female who is here for iron screening colonoscopy.  She denies abdominal pain change in bowel habits or rectal bleeding.  Last colonoscopy was in 2012 with removal of few small polyps and these are hyperplastic.  Family history is positive for CRC in father who was diagnosed at age 32 and died at 52.  She has a brother who was diagnosed with UC at age 16 and is doing fine at age 14.  Past Medical History:  Diagnosis Date  . Anxiety   . 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 Diverticulosis   . Hypertension   . Right arm numbness   . Shingles     Past Surgical History:  Procedure Laterality Date  . ABDOMINAL HYSTERECTOMY    . COLONOSCOPY  07/21/2011   Procedure: COLONOSCOPY;  Surgeon: Rogene Houston, MD;  Location: [facility];  Service: Endoscopy;  Laterality: N/A;  . HERNIA REPAIR    . TUBAL LIGATION      Family History  Problem Relation Age of Onset  . Colon cancer Father   . Cancer Mother   . Hypertension Mother   . Hypertension Sister    Social History:  reports that she has never smoked. She has never used smokeless tobacco. She reports current alcohol use of about 6.0 standard drinks of alcohol per week. She reports that she does not use drugs.  Allergies:  Allergies  Allergen Reactions  . Cephalexin Hives    Medications Prior to Admission  Medication Sig Dispense Refill  . amLODipine (NORVASC) 5 MG tablet Take 5 mg by mouth daily.    . Cholecalciferol (VITAMIN D-3 PO) Take 2,500 mcg by mouth daily at 12 noon.     . cromolyn (NASALCROM) 5.2 MG/ACT nasal spray Place 1 spray into both nostrils as needed for allergies.    Marland Kitchen estradiol (ESTRACE) 0.5 MG tablet Take 0.5 mg by mouth daily. 10:am    . NP THYROID 15 MG tablet Take 15 mg by mouth  daily.    . Omega-3-6-9 CAPS Take 1 capsule by mouth daily.     . progesterone (PROMETRIUM) 200 MG capsule Take 400 mg by mouth at bedtime.     . Testosterone 20 % CREA Apply 5 mg topically daily at 12 noon. Apply to labia    . vitamin C (ASCORBIC ACID) 500 MG tablet Take 500 mg by mouth daily.    Marland Kitchen ALPRAZolam (XANAX) 0.25 MG tablet Take 1 tablet (0.25 mg total) by mouth 3 (three) times daily as needed. (Patient not taking: Reported on 04/17/2019) 30 tablet 3  . doxycycline (VIBRA-TABS) 100 MG tablet Take 1 tablet (100 mg total) by mouth 2 (two) times daily. (Patient not taking: Reported on 04/17/2019) 20 tablet 0  . ondansetron (ZOFRAN ODT) 8 MG disintegrating tablet Take 1 tablet (8 mg total) by mouth every 8 (eight) hours as needed for nausea. (Patient not taking: Reported on 04/17/2019) 20 tablet 0    No results found for this or any previous visit (from the past 48 hour(s)). No results found.  ROS  Blood pressure (!) 161/84, pulse 81, temperature 97.8 F (36.6 C), temperature source Oral, resp. rate 14, SpO2 100 %. Physical Exam  Constitutional:  Well-developed thin Caucasian female in NAD.  HENT:  Mouth/Throat: Oropharynx is  clear and moist.  Eyes: Conjunctivae are normal. No scleral icterus.  Neck: No thyromegaly present.  Cardiovascular: Normal rate, regular rhythm and normal heart sounds.  No murmur heard. Respiratory: Effort normal and breath sounds normal.  GI: Soft. She exhibits no distension and no mass. There is no abdominal tenderness.  Musculoskeletal:        General: No edema.  Neurological: She is alert.  Skin: Skin is warm and dry.     Assessment/Plan High risk screening colonoscopy. Family history of CRC in first-degree relative younger than 24.  Heidi Laser, MD 05/30/2019, 9:17 AM

## 2019-05-30 NOTE — Discharge Instructions (Signed)
Resume usual medications as before. High-fiber diet. No driving for 24 hours. Next colonoscopy in 5 years(high risk screening).   Colonoscopy, Adult, Care After This sheet gives you information about how to care for yourself after your procedure. Your doctor may also give you more specific instructions. If you have problems or questions, call your doctor. What can I expect after the procedure? After the procedure, it is common to have:  A small amount of blood in your poop for 24 hours.  Some gas.  Mild cramping or bloating in your belly. Follow these instructions at home: General instructions  For the first 24 hours after the procedure: ? Do not drive or use machinery. ? Do not sign important documents. ? Do not drink alcohol. ? Do your daily activities more slowly than normal. ? Eat foods that are soft and easy to digest.  Take over-the-counter or prescription medicines only as told by your doctor. To help cramping and bloating:   Try walking around.  Put heat on your belly (abdomen) as told by your doctor. Use a heat source that your doctor recommends, such as a moist heat pack or a heating pad. ? Put a towel between your skin and the heat source. ? Leave the heat on for 20-30 minutes. ? Remove the heat if your skin turns bright red. This is especially important if you cannot feel pain, heat, or cold. You can get burned. Eating and drinking   Drink enough fluid to keep your pee (urine) clear or pale yellow.  Return to your normal diet as told by your doctor. Avoid heavy or fried foods that are hard to digest.  Avoid drinking alcohol for as long as told by your doctor. Contact a doctor if:  You have blood in your poop (stool) 2-3 days after the procedure. Get help right away if:  You have more than a small amount of blood in your poop.  You see large clumps of tissue (blood clots) in your poop.  Your belly is swollen.  You feel sick to your stomach  (nauseous).  You throw up (vomit).  You have a fever.  You have belly pain that gets worse, and medicine does not help your pain. Summary  After the procedure, it is common to have a small amount of blood in your poop. You may also have mild cramping and bloating in your belly.  For the first 24 hours after the procedure, do not drive or use machinery, do not sign important documents, and do not drink alcohol.  Get help right away if you have a lot of blood in your poop, feel sick to your stomach, have a fever, or have more belly pain. This information is not intended to replace advice given to you by your health care provider. Make sure you discuss any questions you have with your health care provider. Document Released: 09/03/2010 Document Revised: 06/01/2017 Document Reviewed: 04/25/2016 Elsevier Patient Education  [place].  High-Fiber Diet Fiber, also called dietary fiber, is a type of carbohydrate that is found in fruits, vegetables, whole grains, and beans. A high-fiber diet can have many health benefits. Your health care provider may recommend a high-fiber diet to help:  Prevent constipation. Fiber can make your bowel movements more regular.  Lower your cholesterol.  Relieve the following conditions: ? Swelling of veins in the anus (hemorrhoids). ? Swelling and irritation (inflammation) of specific areas of the digestive tract (uncomplicated diverticulosis). ? A problem of the large intestine (colon)  that sometimes causes pain and diarrhea (irritable bowel syndrome, IBS).  Prevent overeating as part of a weight-loss plan.  Prevent heart disease, type 2 diabetes, and certain cancers. What is my plan? The recommended daily fiber intake in grams (g) includes:  38 g for men age 48 or younger.  30 g for men over age 42.  38 g for women age 53 or younger.  21 g for women over age 31. You can get the recommended daily intake of dietary fiber by:  Eating a  variety of fruits, vegetables, grains, and beans.  Taking a fiber supplement, if it is not possible to get enough fiber through your diet. What do I need to know about a high-fiber diet?  It is better to get fiber through food sources rather than from fiber supplements. There is not a lot of research about how effective supplements are.  Always check the fiber content on the nutrition facts label of any prepackaged food. Look for foods that contain 5 g of fiber or more per serving.  Talk with a diet and nutrition specialist (dietitian) if you have questions about specific foods that are recommended or not recommended for your medical condition, especially if those foods are not listed below.  Gradually increase how much fiber you consume. If you increase your intake of dietary fiber too quickly, you may have bloating, cramping, or gas.  Drink plenty of water. Water helps you to digest fiber. What are tips for following this plan?  Eat a wide variety of high-fiber foods.  Make sure that half of the grains that you eat each day are whole grains.  Eat breads and cereals that are made with whole-grain flour instead of refined flour or white flour.  Eat brown rice, bulgur wheat, or millet instead of white rice.  Start the day with a breakfast that is high in fiber, such as a cereal that contains 5 g of fiber or more per serving.  Use beans in place of meat in soups, salads, and pasta dishes.  Eat high-fiber snacks, such as berries, raw vegetables, nuts, and popcorn.  Choose whole fruits and vegetables instead of processed forms like juice or sauce. What foods can I eat?  Fruits Berries. Pears. Apples. Oranges. Avocado. Prunes and raisins. Dried figs. Vegetables Sweet potatoes. Spinach. Kale. Artichokes. Cabbage. Broccoli. Cauliflower. Green peas. Carrots. Squash. Grains Whole-grain breads. Multigrain cereal. Oats and oatmeal. Brown rice. Barley. Bulgur wheat. [place]. Quinoa. Bran  muffins. Popcorn. Rye wafer crackers. Meats and other proteins [place], kidney, and pinto beans. Soybeans. Split peas. Lentils. Nuts and seeds. Dairy Fiber-fortified yogurt. Beverages Fiber-fortified soy milk. Fiber-fortified orange juice. Other foods Fiber bars. The items listed above may not be a complete list of recommended foods and beverages. Contact a dietitian for more options. What foods are not recommended? Fruits Fruit juice. Cooked, strained fruit. Vegetables Fried potatoes. Canned vegetables. Well-cooked vegetables. Grains White bread. Pasta made with refined flour. White rice. Meats and other proteins Fatty cuts of meat. Fried chicken or fried fish. Dairy Milk. Yogurt. Cream cheese. Sour cream. Fats and oils Butters. Beverages Soft drinks. Other foods Cakes and pastries. The items listed above may not be a complete list of foods and beverages to avoid. Contact a dietitian for more information. Summary  Fiber is a type of carbohydrate. It is found in fruits, vegetables, whole grains, and beans.  There are many health benefits of eating a high-fiber diet, such as preventing constipation, lowering blood cholesterol, helping with weight  loss, and reducing your risk of heart disease, diabetes, and certain cancers.  Gradually increase your intake of fiber. Increasing too fast can result in cramping, bloating, and gas. Drink plenty of water while you increase your fiber.  The best sources of fiber include whole fruits and vegetables, whole grains, nuts, seeds, and beans. This information is not intended to replace advice given to you by your health care provider. Make sure you discuss any questions you have with your health care provider. Document Released: 08/01/2005 Document Revised: 06/05/2017 Document Reviewed: 06/05/2017 Elsevier Patient Education  2020 [employer].  Diverticulosis  Diverticulosis is a condition that develops when small pouches (diverticula) form  in the wall of the large intestine (colon). The colon is where water is absorbed and stool is formed. The pouches form when the inside layer of the colon pushes through weak spots in the outer layers of the colon. You may have a few pouches or many of them. What are the causes? The cause of this condition is not known. What increases the risk? The following factors may make you more likely to develop this condition:  Being older than age 30. Your risk for this condition increases with age. Diverticulosis is rare among people younger than age 86. By age 34, many people have it.  Eating a low-fiber diet.  Having frequent constipation.  Being overweight.  Not getting enough exercise.  Smoking.  Taking over-the-counter pain medicines, like aspirin and ibuprofen.  Having a family history of diverticulosis. What are the signs or symptoms? In most people, there are no symptoms of this condition. If you do have symptoms, they may include:  Bloating.  Cramps in the abdomen.  Constipation or diarrhea.  Pain in the lower left side of the abdomen. How is this diagnosed? This condition is most often diagnosed during an exam for other colon problems. Because diverticulosis usually has no symptoms, it often cannot be diagnosed independently. This condition may be diagnosed by:  Using a flexible scope to examine the colon (colonoscopy).  Taking an X-ray of the colon after dye has been put into the colon (barium enema).  Doing a CT scan. How is this treated? You may not need treatment for this condition if you have never developed an infection related to diverticulosis. If you have had an infection before, treatment may include:  Eating a high-fiber diet. This may include eating more fruits, vegetables, and grains.  Taking a fiber supplement.  Taking a live bacteria supplement (probiotic).  Taking medicine to relax your colon.  Taking antibiotic medicines. Follow these instructions  at home:  Drink 6-8 glasses of water or more each day to prevent constipation.  Try not to strain when you have a bowel movement.  If you have had an infection before: ? Eat more fiber as directed by your health care provider or your diet and nutrition specialist (dietitian). ? Take a fiber supplement or probiotic, if your health care provider approves.  Take over-the-counter and prescription medicines only as told by your health care provider.  If you were prescribed an antibiotic, take it as told by your health care provider. Do not stop taking the antibiotic even if you start to feel better.  Keep all follow-up visits as told by your health care provider. This is important. Contact a health care provider if:  You have pain in your abdomen.  You have bloating.  You have cramps.  You have not had a bowel movement in 3 days. Get help  right away if:  Your pain gets worse.  Your bloating becomes very bad.  You have a fever or chills, and your symptoms suddenly get worse.  You vomit.  You have bowel movements that are bloody or black.  You have bleeding from your rectum. Summary  Diverticulosis is a condition that develops when small pouches (diverticula) form in the wall of the large intestine (colon).  You may have a few pouches or many of them.  This condition is most often diagnosed during an exam for other colon problems.  If you have had an infection related to diverticulosis, treatment may include increasing the fiber in your diet, taking supplements, or taking medicines. This information is not intended to replace advice given to you by your health care provider. Make sure you discuss any questions you have with your health care provider. Document Released: 04/28/2004 Document Revised: 07/14/2017 Document Reviewed: 06/20/2016 Elsevier Patient Education  2020 [employer].

## 2019-06-06 ENCOUNTER — Encounter (HOSPITAL_COMMUNITY): Payer: Self-pay | Admitting: Internal Medicine

## 2019-06-10 ENCOUNTER — Telehealth (INDEPENDENT_AMBULATORY_CARE_PROVIDER_SITE_OTHER): Payer: Self-pay

## 2019-06-10 ENCOUNTER — Other Ambulatory Visit: Payer: Self-pay | Admitting: Obstetrics & Gynecology

## 2019-06-10 DIAGNOSIS — R928 Other abnormal and inconclusive findings on diagnostic imaging of breast: Secondary | ICD-10-CM

## 2019-06-10 NOTE — Telephone Encounter (Signed)
Patient wants to know if she should keep taking estrogen because she had a abnormal mammogram.  She would like to take to you.

## 2019-06-10 NOTE — Telephone Encounter (Signed)
I spoke to the patient regarding bioidentical hormones and I told her for the time being, she is to stop taking estradiol, progesterone and testosterone.  We will see what the diagnostic mammogram shows.

## 2019-06-19 ENCOUNTER — Other Ambulatory Visit: Payer: Self-pay | Admitting: Obstetrics & Gynecology

## 2019-06-19 ENCOUNTER — Ambulatory Visit
Admission: RE | Admit: 2019-06-19 | Discharge: 2019-06-19 | Disposition: A | Discharge status: Home / Self Care | Payer: Medicare HMO | Source: Ambulatory Visit | Attending: Obstetrics & Gynecology | Admitting: Obstetrics & Gynecology

## 2019-06-19 ENCOUNTER — Other Ambulatory Visit: Payer: Self-pay

## 2019-06-19 DIAGNOSIS — R928 Other abnormal and inconclusive findings on diagnostic imaging of breast: Secondary | ICD-10-CM

## 2019-06-27 ENCOUNTER — Telehealth (INDEPENDENT_AMBULATORY_CARE_PROVIDER_SITE_OTHER): Payer: Self-pay

## 2019-07-01 ENCOUNTER — Encounter (INDEPENDENT_AMBULATORY_CARE_PROVIDER_SITE_OTHER): Payer: Self-pay | Admitting: Internal Medicine

## 2019-07-02 NOTE — Telephone Encounter (Signed)
Notified pt of mychart message, and call . Pt message was 1st routed to [facility]. So I resent to Dr Anastasio Champion to address her concerns. Waiting for him to mychart contact her back.

## 2019-07-12 ENCOUNTER — Other Ambulatory Visit (INDEPENDENT_AMBULATORY_CARE_PROVIDER_SITE_OTHER): Payer: Self-pay | Admitting: Internal Medicine

## 2019-07-17 ENCOUNTER — Other Ambulatory Visit: Payer: Self-pay

## 2019-07-17 ENCOUNTER — Ambulatory Visit (INDEPENDENT_AMBULATORY_CARE_PROVIDER_SITE_OTHER): Payer: Medicare HMO | Admitting: Internal Medicine

## 2019-07-17 ENCOUNTER — Encounter (INDEPENDENT_AMBULATORY_CARE_PROVIDER_SITE_OTHER): Payer: Self-pay | Admitting: Internal Medicine

## 2019-07-17 VITALS — BP 122/73 | HR 78 | Temp 98.3°F | Resp 18 | Ht 65.0 in | Wt 123.0 lb

## 2019-07-17 DIAGNOSIS — E559 Vitamin D deficiency, unspecified: Secondary | ICD-10-CM | POA: Diagnosis not present

## 2019-07-17 DIAGNOSIS — E782 Mixed hyperlipidemia: Secondary | ICD-10-CM

## 2019-07-17 DIAGNOSIS — E2839 Other primary ovarian failure: Secondary | ICD-10-CM | POA: Diagnosis not present

## 2019-07-17 DIAGNOSIS — I10 Essential (primary) hypertension: Secondary | ICD-10-CM | POA: Diagnosis not present

## 2019-07-17 DIAGNOSIS — E785 Hyperlipidemia, unspecified: Secondary | ICD-10-CM

## 2019-07-17 DIAGNOSIS — Z0001 Encounter for general adult medical examination with abnormal findings: Secondary | ICD-10-CM | POA: Diagnosis not present

## 2019-07-17 DIAGNOSIS — M25571 Pain in right ankle and joints of right foot: Secondary | ICD-10-CM

## 2019-07-17 DIAGNOSIS — Z1159 Encounter for screening for other viral diseases: Secondary | ICD-10-CM

## 2019-07-17 HISTORY — DX: Hyperlipidemia, unspecified: E78.5

## 2019-07-17 NOTE — Progress Notes (Signed)
Chief Complaint: This very pleasant 68 year old lady comes in for an annual physical exam and to address her chronic conditions which are described below. HPI: She has a history of hypertension and takes amlodipine.  She has no history of coronary artery disease or cerebrovascular disease.  She denies any chest pain, dyspnea, palpitations or limb weakness. She recently had a slightly abnormal mammogram and diagnostic ultrasound was done which showed some asymmetry in the breast with fibroglandular tissue.  She is due to have a repeat diagnostic mammogram in May of next year.  In the meantime, she has discontinued all bioidentical hormones including estradiol, progesterone and testosterone. She does continue desiccated thyroid.  She is tolerating this. She also continues to take vitamin D3 supplementation for vitamin D deficiency. She is complaining of right ankle discomfort and she relates this to discontinuing the bioidentical hormones.  She denies any significant swelling in the ankle joint.  Past Medical History:  Diagnosis Date  . Anxiety   . 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 Diverticulosis   . HLD (hyperlipidemia) 07/17/2019  . Hypertension   . Right arm numbness   . Shingles    Past Surgical History:  Procedure Laterality Date  . ABDOMINAL HYSTERECTOMY    . COLONOSCOPY  07/21/2011   Procedure: COLONOSCOPY;  Surgeon: Rogene Houston, MD;  [facility];  Service: Endoscopy;  Laterality: N/A;  . COLONOSCOPY N/A 05/30/2019   Procedure: COLONOSCOPY;  Surgeon: Rogene Houston, MD;  [facility];  Service: Endoscopy;  Laterality: N/A;  830  . HERNIA REPAIR    . 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    Social History   Tobacco Use  . Smoking status: Never Smoker  . Smokeless tobacco: Never Used  Substance Use Topics  . Alcohol use: Yes    Alcohol/week: 6.0 standard drinks    Types: 6 Cans of beer per week    Frequency: Never      Allergies:  Allergies  Allergen Reactions  . Cephalexin Hives     Current Meds  Medication Sig  . amLODipine (NORVASC) 5 MG tablet Take 5 mg by mouth daily.  . Cholecalciferol (VITAMIN D-3) 25 MCG (1000 UT) CAPS Take 5,000 mcg by mouth daily at 12 noon.   . cromolyn (NASALCROM) 5.2 MG/ACT nasal spray Place 1 spray into both nostrils as needed for allergies.  . NP THYROID 15 MG tablet TAKE ONE (1) TABLET EACH DAY  . Omega-3-6-9 CAPS Take 1 capsule by mouth daily.   . vitamin C (ASCORBIC ACID) 500 MG tablet Take 500 mg by mouth daily.  . [DISCONTINUED] estradiol (ESTRACE) 0.5 MG tablet Take 0.5 mg by mouth daily. 10:am  . [DISCONTINUED] progesterone (PROMETRIUM) 200 MG capsule Take 400 mg by mouth at bedtime.   . [DISCONTINUED] Testosterone 20 % CREA Apply 5 mg topically daily at 12 noon. Apply to labia      MCN:OBSJG from the symptoms mentioned above,there are no other symptoms referable to all systems reviewed.  Physical Exam: Blood pressure 122/73, pulse 78, temperature 98.3 F (36.8 C), temperature source Temporal, resp. rate 18, height 5' 5"  (1.651 m), weight 123 lb (55.8  kg), SpO2 98 %. Vitals with BMI 07/17/2019 05/30/2019 05/30/2019  Height 5' 5"  - -  Weight 123 lbs - -  BMI 99.77 - -  Systolic 414 239 532  Diastolic 73 87 69  Pulse 78 75 70      She was systemically well. General: Alert, cooperative, and appears to be the stated age.No pallor.  No jaundice.  No clubbing. Head: Normocephalic Eyes: Sclera white, pupils equal and reactive to light, red reflex x 2,  Ears: Normal bilaterally Oral cavity: Lips, mucosa, and tongue normal: Teeth and gums normal  Neck: No adenopathy, supple, symmetrical, trachea midline, and thyroid does not appear enlarged Respiratory: Clear to auscultation bilaterally.No wheezing, crackles or bronchial breathing. Cardiovascular: Heart sounds are present and appear to be normal without murmurs or added sounds.  No carotid bruits.  Peripheral pulses are present and equal bilaterally.: Gastrointestinal:positive bowel sounds, no hepatosplenomegaly.  No masses felt.No tenderness. Skin: Clear, No rashes noted.No worrisome skin lesions seen. Neurological: Grossly intact without focal findings, cranial nerves II through XII intact, muscle strength equal bilaterally Musculoskeletal: No acute joint abnormalities noted.Full range of movement noted with joints.  Specific examination of the right ankle shows no abnormalities. Psychiatric: Affect appropriate, non-anxious.    Assessment  1. Essential hypertension, benign   2. Primary ovarian failure   3. Encounter for general adult medical examination with abnormal findings   4. Vitamin D deficiency disease   5. Mixed hyperlipidemia   6. Encounter for hepatitis C screening test for low risk patient   7. Acute right ankle pain     Tests Ordered:   Orders Placed This Encounter  Procedures  . CBC  . CMP with eGFR([facility])  . Lipid Panel  . T3, Free  . TSH  . Vitamin D, 25-hydroxy  . Hep C Antibody     Plan  1. Blood work is ordered above. 2. She will continue with amlodipine for hypertension which seems to be can be keeping her blood pressure under good control. 3. I discussed with the possibility of restarting bioidentical hormone therapy if her mammogram next year is within normal limits. 4. Her hyperlipidemia previously has not required statin therapy. 5. The right ankle pain I do not think needs any further investigation at this point. 6. She will continue with vitamin D3 supplementation and we will check levels to see if we need to optimize this further. 7.  Follow-up in June of next year with me to discuss by identical hormone therapy again.  Prevnar 13 vaccination was given today. 8. Today, in addition to a preventative visit, I performed an office visit to address her chronic conditions above.     No orders of the defined types were placed in this encounter.    Yehudit Fulginiti C Ziah Turvey   07/17/2019, 11:10 AM

## 2019-07-18 LAB — LIPID PANEL
Cholesterol: 220 mg/dL — ABNORMAL HIGH (ref <200)
HDL: 103 mg/dL (ref >=50)
LDL Cholesterol (Calc): 100 mg/dL (calc) — ABNORMAL HIGH
Non-HDL Cholesterol (Calc): 117 mg/dL (calc) (ref <130)
Total CHOL/HDL Ratio: 2.1 (calc) (ref <5.0)
Triglycerides: 79 mg/dL (ref <150)

## 2019-07-18 LAB — TSH: TSH: 1.26 mIU/L (ref 0.40–4.50)

## 2019-07-18 LAB — COMPLETE METABOLIC PANEL WITH GFR
AG Ratio: 1.9 (calc) (ref 1.0–2.5)
ALT: 35 U/L — ABNORMAL HIGH (ref 6–29)
AST: 48 U/L — ABNORMAL HIGH (ref 10–35)
Albumin: 5 g/dL (ref 3.6–5.1)
Alkaline phosphatase (APISO): 65 U/L (ref 37–153)
BUN: 8 mg/dL (ref 7–25)
CO2: 25 mmol/L (ref 20–32)
Calcium: 9.9 mg/dL (ref 8.6–10.4)
Chloride: 102 mmol/L (ref 98–110)
Creat: 0.81 mg/dL (ref 0.50–0.99)
GFR, Est African American: 88 mL/min/{1.73_m2} (ref >=60)
GFR, Est Non African American: 76 mL/min/{1.73_m2} (ref >=60)
Globulin: 2.7 g/dL (calc) (ref 1.9–3.7)
Glucose, Bld: 88 mg/dL (ref 65–99)
Potassium: 3.6 mmol/L (ref 3.5–5.3)
Sodium: 140 mmol/L (ref 135–146)
Total Bilirubin: 0.6 mg/dL (ref 0.2–1.2)
Total Protein: 7.7 g/dL (ref 6.1–8.1)

## 2019-07-18 LAB — CBC
HCT: 42.1 % (ref 35.0–45.0)
Hemoglobin: 14.3 g/dL (ref 11.7–15.5)
MCH: 30.9 pg (ref 27.0–33.0)
MCHC: 34 g/dL (ref 32.0–36.0)
MCV: 90.9 fL (ref 80.0–100.0)
MPV: 9.7 fL (ref 7.5–12.5)
Platelets: 368 10*3/uL (ref 140–400)
RBC: 4.63 10*6/uL (ref 3.80–5.10)
RDW: 12.2 % (ref 11.0–15.0)
WBC: 6.5 10*3/uL (ref 3.8–10.8)

## 2019-07-18 LAB — HEPATITIS C ANTIBODY
Hepatitis C Ab: NONREACTIVE
SIGNAL TO CUT-OFF: 0.01 (ref <1.00)

## 2019-07-18 LAB — T3, FREE: T3, Free: 3.1 pg/mL (ref 2.3–4.2)

## 2019-07-18 LAB — VITAMIN D 25 HYDROXY (VIT D DEFICIENCY, FRACTURES): Vit D, 25-Hydroxy: 87 ng/mL (ref 30–100)

## 2019-09-30 ENCOUNTER — Ambulatory Visit (INDEPENDENT_AMBULATORY_CARE_PROVIDER_SITE_OTHER): Payer: Medicare HMO | Admitting: Nurse Practitioner

## 2019-10-21 ENCOUNTER — Other Ambulatory Visit (INDEPENDENT_AMBULATORY_CARE_PROVIDER_SITE_OTHER): Payer: Self-pay | Admitting: Internal Medicine

## 2019-10-28 ENCOUNTER — Other Ambulatory Visit (INDEPENDENT_AMBULATORY_CARE_PROVIDER_SITE_OTHER): Payer: Self-pay | Admitting: Internal Medicine

## 2019-10-31 ENCOUNTER — Ambulatory Visit (INDEPENDENT_AMBULATORY_CARE_PROVIDER_SITE_OTHER): Payer: Medicare HMO | Admitting: Nurse Practitioner

## 2019-11-20 ENCOUNTER — Ambulatory Visit: Payer: Medicare HMO | Admitting: Dermatology

## 2019-11-21 ENCOUNTER — Ambulatory Visit (INDEPENDENT_AMBULATORY_CARE_PROVIDER_SITE_OTHER): Payer: Medicare HMO | Admitting: Nurse Practitioner

## 2019-11-29 ENCOUNTER — Ambulatory Visit (INDEPENDENT_AMBULATORY_CARE_PROVIDER_SITE_OTHER): Payer: Medicare HMO | Admitting: Nurse Practitioner

## 2019-11-29 ENCOUNTER — Encounter (INDEPENDENT_AMBULATORY_CARE_PROVIDER_SITE_OTHER): Payer: Self-pay | Admitting: Nurse Practitioner

## 2019-11-29 ENCOUNTER — Telehealth (INDEPENDENT_AMBULATORY_CARE_PROVIDER_SITE_OTHER): Payer: Self-pay | Admitting: Nurse Practitioner

## 2019-11-29 ENCOUNTER — Other Ambulatory Visit: Payer: Self-pay

## 2019-11-29 VITALS — BP 140/80

## 2019-11-29 DIAGNOSIS — Z0000 Encounter for general adult medical examination without abnormal findings: Secondary | ICD-10-CM | POA: Diagnosis not present

## 2019-11-29 NOTE — Telephone Encounter (Signed)
Heidi Leblanc, please print out the after visit summary from office visit dated 11/29/2019 and mailed to patient's home.

## 2019-11-29 NOTE — Progress Notes (Signed)
Due to national recommendations of social distancing related to the [place] pandemic, an audio/visual tele-health visit was felt to be the most appropriate encounter type for this patient today. I connected with  Heidi Leblanc on 11/29/19 utilizing audio/visual technology and verified that I am speaking with the correct person using two identifiers. The patient was located at their home, and I was located at home during the encounter. I discussed the limitations of evaluation and management by telemedicine. The patient expressed understanding and agreed to proceed.  About three quarters of the way through the visual component started to cut out, thus the visit was completed using audio only technology.     Subjective:   Heidi Leblanc is a 69 y.o. female who presents for Medicare Annual (Subsequent) preventive examination.  Review of Systems:  Negative Cardiac Risk Factors include: advanced age (>17men, >24 women)     Objective:     Vitals: BP 140/80   There is no height or weight on file to calculate BMI.  Advanced Directives 11/29/2019 05/30/2019 07/21/2011  Does Patient Have a Medical Advance Directive? Yes No Patient does not have advance directive;Patient would like information  Type of Advance Directive Living will - -  Does patient want to make changes to medical advance directive? No - Patient declined - -  Would patient like information on creating a medical advance directive? - Yes (MAU/Ambulatory/Procedural Areas - Information given) Advance directive packet given  Pre-existing out of facility DNR order (yellow form or pink MOST form) - - No    Tobacco Social History   Tobacco Use  Smoking Status Never Smoker  Smokeless Tobacco Never Used     Counseling given: Not Answered   Clinical Intake:  Pre-visit preparation completed: Yes  Pain : No/denies pain     BMI - recorded: 20.47 Nutritional Status: BMI of 19-24  Normal Diabetes: No  How often do you need to have  someone help you when you read instructions, pamphlets, or other written materials from your doctor or pharmacy?: 1 - Never What is the last grade level you completed in school?: some college  Interpreter Needed?: No  Information entered by :: Jeralyn Ruths, NP-C  Past Medical History:  Diagnosis Date  . Anxiety   . 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 Diverticulosis   . Fibroadenoma    Right breast  . HLD (hyperlipidemia) 07/17/2019  . Hypertension   . Right arm numbness   . Shingles    Past Surgical History:  Procedure Laterality Date  . ABDOMINAL HYSTERECTOMY    . COLONOSCOPY  07/21/2011   Procedure: COLONOSCOPY;  Surgeon: Rogene Houston, MD;  [facility];  Service: Endoscopy;  Laterality: N/A;  . COLONOSCOPY N/A 05/30/2019   Procedure: COLONOSCOPY;  Surgeon: Rogene Houston, MD;  [facility];  Service: Endoscopy;  Laterality: N/A;  830  . HERNIA REPAIR    . TUBAL LIGATION     Family History  Problem Relation Age of Onset  . Colon cancer Father   . Cancer Mother   . Hypertension Mother   . Hypertension Sister    Social History   Socioeconomic History  . Marital status: Divorced    Spouse name: Not on file  . Number of children: Not on file  . Years of education: Not on file  . Highest education level: Not on file  Occupational  History  . Not on file  Tobacco Use  . Smoking status: Never Smoker  . Smokeless tobacco: Never Used  Substance and Sexual Activity  . Alcohol use: Yes    Alcohol/week: 6.0 standard drinks    Types: 6 Cans of beer per week  . Drug use: No  . Sexual activity: Not on file  Other Topics Concern  . 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   Social Determinants of Health   Financial Resource Strain:   . Difficulty of Paying Living Expenses:   Food Insecurity:   . Worried About Charity fundraiser in the Last Year:   . Arboriculturist in the Last Year:   Transportation Needs:   . Film/video editor (Medical):   Marland Kitchen Lack of Transportation (Non-Medical):   Physical Activity:   . Days of Exercise per Week:   . Minutes of Exercise per Session:   Stress:   . Feeling of Stress :   Social Connections:   . Frequency of Communication with Friends and Family:   . Frequency of Social Gatherings with Friends and Family:   . Attends Religious Services:   . Active Member of Clubs or Organizations:   . Attends Archivist Meetings:   Marland Kitchen Marital Status:     Outpatient Encounter Medications as of 11/29/2019  Medication Sig  . amLODipine (NORVASC) 5 MG tablet TAKE ONE (1) TABLET EACH DAY  . Cholecalciferol (VITAMIN D-3) 25 MCG (1000 UT) CAPS Take 5,000 mcg by mouth daily at 12 noon.   . cromolyn (NASALCROM) 5.2 MG/ACT nasal spray Place 1 spray into both nostrils as needed for allergies.  . Omega-3-6-9 CAPS Take 1 capsule by mouth daily.   . vitamin C (ASCORBIC ACID) 500 MG tablet Take 500 mg by mouth daily.  . Vitamin E 100 units TABS Take 125 mcg by mouth.  . NP THYROID 15 MG tablet TAKE ONE (1) TABLET EACH DAY  . [DISCONTINUED] NP THYROID 15 MG tablet Take 15 mg by mouth daily.  . [DISCONTINUED] NP THYROID 15 MG tablet TAKE ONE (1) TABLET EACH DAY   No facility-administered encounter medications on file as of 11/29/2019.    Activities of Daily Living In your present state of health, do you have any difficulty performing the following activities: 11/29/2019  Hearing? N  Vision? N  Difficulty concentrating or making decisions? N  Walking or climbing stairs? N  Dressing or bathing? N  Doing errands, shopping? N  Preparing Food and eating ? N  Using the Toilet? N  In the past six months, have you  accidently leaked urine? N  Do you have problems with loss of bowel control? N  Managing your Medications? N  Managing your Finances? N  Housekeeping or managing your Housekeeping? N  Some recent data might be hidden    Patient Care Team: Ailene Ards, NP as PCP - General (Nurse Practitioner) Maisie Fus, MD as Attending Physician (Obstetrics and Gynecology) Rogene Houston, MD as Attending Physician (Gastroenterology)    Assessment:   This is a routine wellness examination for Heidi Leblanc.  Exercise Activities and Dietary recommendations Current Exercise Habits: Home exercise routine, Type of exercise: walking, Time (Minutes): 20, Frequency (Times/Week): 5, Weekly Exercise (Minutes/Week): 100, Intensity: Mild, Exercise limited by: None identified  Goals  None     Fall Risk Fall Risk  11/29/2019 05/28/2019  Falls in the past year? 0 0  Number falls in past yr: 0 0  Injury with Fall? 0 0  Follow up Falls evaluation completed;Education provided;Falls prevention discussed Falls evaluation completed    Timed Get Up and Go performed: Not performed this visit was conducted remotely  Depression Screen PHQ 2/9 Scores 11/29/2019 05/28/2019  PHQ - 2 Score 0 0     Cognitive Function     6CIT Screen 11/29/2019  What Year? 0 points  What month? 0 points  What time? 0 points  Count back from 20 0 points  Months in reverse 0 points  Repeat phrase 0 points  Total Score 0    Immunization History  Administered Date(s) Administered  . Fluad Quad(high Dose 65+) 05/28/2019  . Pneumococcal Conjugate-13 07/17/2019  . Tdap 08/14/2012  . Zoster Recombinat (Shingrix) 05/28/2019    Qualifies for Shingles Vaccine?  Already completed  Screening Tests Health Maintenance  Topic Date Due  . INFLUENZA VACCINE  03/15/2020  . PNA vac Low Risk Adult (2 of 2 - PPSV23) 07/16/2020  . MAMMOGRAM  06/04/2021  . TETANUS/TDAP  08/14/2022  . COLONOSCOPY  05/29/2024  . DEXA SCAN  Completed  .  Hepatitis C Screening  Completed    Cancer Screenings: Lung: Low Dose CT Chest recommended if Age 50-80 years, 30 pack-year currently smoking OR have quit w/in 15years. Patient does not qualify. Breast:  Up to date on Mammogram? Yes   Up to date of Bone Density/Dexa? Yes Colorectal: Due to repeated in 2025  Additional Screenings: Hepatitis C Screening: Completed negative     Plan:   I encouraged patient to consider the COVID-19 vaccine.  Otherwise she is up-to-date on all vaccines right now.  As far screenings are concerned she is up-to-date with all screenings except for sexual transmitted infection screenings, and she has elected to not undergo this at this time.  She will follow-up as scheduled in 2 months with Dr. Anastasio Champion.  I have personally reviewed and noted the following in the patient's chart:   . Medical and social history . Use of alcohol, tobacco or illicit drugs  . Current medications and supplements . Functional ability and status . Nutritional status . Physical activity . Advanced directives . List of other physicians . Hospitalizations, surgeries, and ER visits in previous 12 months . Vitals . Screenings to include cognitive, depression, and falls . Referrals and appointments  In addition, I have reviewed and discussed with patient certain preventive protocols, quality metrics, and best practice recommendations. A written personalized care plan for preventive services as well as general preventive health recommendations were provided to patient.     Ailene Ards, NP  11/29/2019

## 2019-11-29 NOTE — Patient Instructions (Signed)
  Heidi Leblanc , Thank you for taking time to come for your Medicare Wellness Visit. I appreciate your ongoing commitment to your health goals. Please review the following plan we discussed and let me know if I can assist you in the future.   These are the goals we discussed: Goals   None     This is a list of the screening recommended for you and due dates:  Health Maintenance  Topic Date Due  . Flu Shot  03/15/2020  . Pneumonia vaccines (2 of 2 - PPSV23) 07/16/2020  . Mammogram  06/04/2021  . Tetanus Vaccine  08/14/2022  . Colon Cancer Screening  05/29/2024  . DEXA scan (bone density measurement)  Completed  .  Hepatitis C: One time screening is recommended by Center for Disease Control  (CDC) for  adults born from [birth year] through 1963.   Completed

## 2019-12-02 NOTE — Telephone Encounter (Signed)
Noted. Done.

## 2019-12-12 ENCOUNTER — Ambulatory Visit (INDEPENDENT_AMBULATORY_CARE_PROVIDER_SITE_OTHER): Payer: Medicare HMO | Admitting: Nurse Practitioner

## 2019-12-18 ENCOUNTER — Ambulatory Visit: Payer: Medicare HMO | Admitting: Dermatology

## 2020-01-01 ENCOUNTER — Encounter (INDEPENDENT_AMBULATORY_CARE_PROVIDER_SITE_OTHER): Payer: Self-pay | Admitting: Internal Medicine

## 2020-01-15 ENCOUNTER — Ambulatory Visit (INDEPENDENT_AMBULATORY_CARE_PROVIDER_SITE_OTHER): Payer: Medicare HMO | Admitting: Internal Medicine

## 2020-01-21 ENCOUNTER — Other Ambulatory Visit (INDEPENDENT_AMBULATORY_CARE_PROVIDER_SITE_OTHER): Payer: Self-pay | Admitting: Internal Medicine

## 2020-01-30 ENCOUNTER — Ambulatory Visit
Admission: RE | Admit: 2020-01-30 | Discharge: 2020-01-30 | Disposition: A | Discharge status: Home / Self Care | Payer: Medicare HMO | Source: Ambulatory Visit | Attending: Obstetrics & Gynecology | Admitting: Obstetrics & Gynecology

## 2020-01-30 ENCOUNTER — Other Ambulatory Visit: Payer: Self-pay | Admitting: Obstetrics & Gynecology

## 2020-01-30 ENCOUNTER — Other Ambulatory Visit: Payer: Self-pay

## 2020-01-30 DIAGNOSIS — R928 Other abnormal and inconclusive findings on diagnostic imaging of breast: Secondary | ICD-10-CM

## 2020-02-04 ENCOUNTER — Ambulatory Visit (INDEPENDENT_AMBULATORY_CARE_PROVIDER_SITE_OTHER): Payer: Medicare HMO | Admitting: Internal Medicine

## 2020-02-04 ENCOUNTER — Other Ambulatory Visit: Payer: Self-pay

## 2020-02-04 ENCOUNTER — Encounter (INDEPENDENT_AMBULATORY_CARE_PROVIDER_SITE_OTHER): Payer: Self-pay | Admitting: Internal Medicine

## 2020-02-04 VITALS — BP 130/70 | HR 60 | Temp 98.0°F | Resp 18 | Ht 66.0 in | Wt 122.0 lb

## 2020-02-04 DIAGNOSIS — E2839 Other primary ovarian failure: Secondary | ICD-10-CM

## 2020-02-04 DIAGNOSIS — E559 Vitamin D deficiency, unspecified: Secondary | ICD-10-CM

## 2020-02-04 DIAGNOSIS — E782 Mixed hyperlipidemia: Secondary | ICD-10-CM | POA: Diagnosis not present

## 2020-02-04 DIAGNOSIS — I10 Essential (primary) hypertension: Secondary | ICD-10-CM | POA: Diagnosis not present

## 2020-02-04 MED ORDER — THYROID 15 MG PO TABS: ORAL_TABLET | ORAL | 3 refills | Status: DC

## 2020-02-04 NOTE — Progress Notes (Signed)
Metrics: Intervention Frequency ACO  Documented Smoking Status Yearly  Screened one or more times in 24 months  Cessation Counseling or  Active cessation medication Past 24 months  Past 24 months   Guideline developer: UpToDate (See UpToDate for funding source) Date Released: 2014       Wellness Office Visit  Subjective:  Patient ID: Heidi Leblanc, female    DOB: 1950/08/28  Age: 69 y.o. MRN: [id number]  CC: This lady comes in for follow-up of hypertension and bioidentical hormone therapy. HPI  More than 6 months ago, we discontinued estradiol and progesterone because her mammogram was somewhat worrisome.  She has had repeat mammogram about a week ago and there is no changes that have progressed and she has benign right breast asymmetry.  There is no evidence of calcifications masses or areas of distortion seen in either breast. Since she has been off estradiol and progesterone, she has less energy and does not sleep well.  She would like to go back on the hormones.  She has a supply at home.  She cannot remember the dose that she was taking but I believe it was estradiol 0.5 mg in the morning and progesterone probably 200 mg at night. Past Medical History:  Diagnosis Date  . Anxiety   . 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 Diverticulosis   . Fibroadenoma    Right breast  . HLD (hyperlipidemia) 07/17/2019  . Hypertension   . Right arm numbness   . Shingles    Past Surgical History:  Procedure Laterality Date  . ABDOMINAL HYSTERECTOMY    . COLONOSCOPY  07/21/2011   Procedure: COLONOSCOPY;  Surgeon: Rogene Houston, MD;  [facility];  Service: Endoscopy;  Laterality: N/A;  . COLONOSCOPY N/A 05/30/2019   Procedure: COLONOSCOPY;  Surgeon: Rogene Houston, MD;  [facility];  Service: Endoscopy;  Laterality: N/A;  830  . HERNIA REPAIR    . TUBAL LIGATION        Family History  Problem Relation Age of Onset  . Colon cancer Father   . Cancer Mother   . Hypertension Mother   . 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   Social History   Tobacco Use  . Smoking status: Never Smoker  . Smokeless tobacco: Never Used  Substance Use Topics  . Alcohol use: Yes    Alcohol/week: 6.0 standard drinks    Types: 6 Cans of beer per week    Current Meds  Medication Sig  . amLODipine (NORVASC) 5 MG tablet TAKE ONE (1) TABLET EACH DAY  . Cholecalciferol (VITAMIN D-3) 25 MCG (1000 UT) CAPS Take 5,000 mcg by mouth daily at 12 noon.   . Omega-3-6-9 CAPS Take 1 capsule by mouth daily.   . vitamin C (ASCORBIC ACID) 500 MG tablet Take 500 mg by mouth daily.  . Vitamin E 100 units TABS Take 125 mcg by mouth.      Depression screen [facility] 2/9 11/29/2019 05/28/2019  Decreased Interest 0 0  Down, Depressed, Hopeless 0 0  PHQ - 2 Score 0 0     Objective:   Today's Vitals: BP 130/70 (BP Location: Left Arm, Patient Position: Sitting, Cuff  Size: Normal)   Pulse 60   Temp 98 F (36.7 C) (Temporal)   Resp 18   Ht 5\' 6"  (1.676 m)   Wt 122 lb (55.3 kg)   SpO2 98%   BMI 19.69 kg/m  Vitals with BMI 02/04/2020 11/29/2019 07/17/2019  Height 5\' 6"  - 5\' 5"   Weight 122 lbs - 123 lbs  BMI 92.9 - 09.03  Systolic 014 996 924  Diastolic 70 80 73  Pulse 60 - 78     Physical Exam   She looks systemically well.  Her blood pressure is well controlled at the present time.  She is alert and orientated without any obvious focal neurological signs.    Assessment   1. Primary ovarian failure   2. Essential hypertension, benign   3. Vitamin D deficiency disease   4. Mixed hyperlipidemia       Tests ordered No orders of the defined types  were placed in this encounter.    Plan: 1. She will restart estradiol and progesterone at home and she will let me know if she requires refills. 2. She will continue with amlodipine which is controlling her blood pressure well. 3. I have refilled her desiccated NP thyroid, which is being used off label, for symptoms of thyroid deficiency. 4. Follow-up in 6 weeks and we will check blood levels.   Meds ordered this encounter  Medications  . thyroid (NP THYROID) 15 MG tablet    Sig: TAKE ONE (1) TABLET EACH DAY    Dispense:  30 tablet    Refill:  3    Nneoma Harral Luther Parody, MD

## 2020-03-26 ENCOUNTER — Ambulatory Visit (INDEPENDENT_AMBULATORY_CARE_PROVIDER_SITE_OTHER): Payer: Medicare HMO | Admitting: Internal Medicine

## 2020-04-22 ENCOUNTER — Other Ambulatory Visit (INDEPENDENT_AMBULATORY_CARE_PROVIDER_SITE_OTHER): Payer: Self-pay | Admitting: Internal Medicine

## 2020-06-05 ENCOUNTER — Other Ambulatory Visit: Payer: Self-pay

## 2020-06-05 ENCOUNTER — Ambulatory Visit
Admission: RE | Admit: 2020-06-05 | Discharge: 2020-06-05 | Disposition: A | Discharge status: Home / Self Care | Payer: Medicare HMO | Source: Ambulatory Visit | Attending: Obstetrics & Gynecology | Admitting: Obstetrics & Gynecology

## 2020-06-05 DIAGNOSIS — R928 Other abnormal and inconclusive findings on diagnostic imaging of breast: Secondary | ICD-10-CM

## 2020-06-10 ENCOUNTER — Ambulatory Visit (INDEPENDENT_AMBULATORY_CARE_PROVIDER_SITE_OTHER): Payer: Medicare HMO | Admitting: Internal Medicine

## 2020-06-10 ENCOUNTER — Other Ambulatory Visit: Payer: Self-pay

## 2020-06-10 ENCOUNTER — Encounter (INDEPENDENT_AMBULATORY_CARE_PROVIDER_SITE_OTHER): Payer: Self-pay | Admitting: Internal Medicine

## 2020-06-10 VITALS — BP 120/76 | HR 79 | Temp 97.5°F | Ht 63.5 in | Wt 123.0 lb

## 2020-06-10 DIAGNOSIS — E782 Mixed hyperlipidemia: Secondary | ICD-10-CM

## 2020-06-10 DIAGNOSIS — E559 Vitamin D deficiency, unspecified: Secondary | ICD-10-CM

## 2020-06-10 DIAGNOSIS — N390 Urinary tract infection, site not specified: Secondary | ICD-10-CM

## 2020-06-10 DIAGNOSIS — Z23 Encounter for immunization: Secondary | ICD-10-CM

## 2020-06-10 DIAGNOSIS — I10 Essential (primary) hypertension: Secondary | ICD-10-CM

## 2020-06-10 MED ORDER — THYROID 15 MG PO TABS: ORAL_TABLET | ORAL | 3 refills | Status: DC

## 2020-06-10 NOTE — Addendum Note (Signed)
Addended by: Doree Albee on: 06/10/2020 10:41 AM   Modules accepted: Orders

## 2020-06-10 NOTE — Progress Notes (Addendum)
Metrics: Intervention Frequency ACO  Documented Smoking Status Yearly  Screened one or more times in 24 months  Cessation Counseling or  Active cessation medication Past 24 months  Past 24 months   Guideline developer: UpToDate (See UpToDate for funding source) Date Released: 2014       Wellness Office Visit  Subjective:  Patient ID: Heidi Leblanc, female    DOB: 06-25-1951  Age: 69 y.o. MRN: [id number]  CC: This lady comes in for follow-up of symptoms of thyroid hypofunction and hypertension. HPI  When we met the last time, she said she would start bioidentical hormone therapy again but she went home and thought about it and decided not to. She had a repeat mammogram this month which was negative and the recommendation is to do a follow-up mammogram in 1 year. She is feeling well.  She denies any chest pain, dyspnea, palpitations or limb weakness. She also had symptoms of possible UTI and would like her urine checked. Past Medical History:  Diagnosis Date  . Anxiety   . 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 Diverticulosis   . Fibroadenoma    Right breast  . HLD (hyperlipidemia) 07/17/2019  . Hypertension   . Right arm numbness   . Shingles    Past Surgical History:  Procedure Laterality Date  . ABDOMINAL HYSTERECTOMY    . BREAST CYST ASPIRATION Left 2004  . COLONOSCOPY  07/21/2011   Procedure: COLONOSCOPY;  Surgeon: Rogene Houston, MD;  Location: [facility];  Service: Endoscopy;  Laterality: N/A;  . COLONOSCOPY N/A 05/30/2019   Procedure: COLONOSCOPY;  Surgeon: Rogene Houston, MD;  Location: [facility];  Service: Endoscopy;  Laterality: N/A;  830  . HERNIA REPAIR    . TUBAL LIGATION       Family History  Problem Relation Age of Onset  . Colon cancer Father   . Cancer Mother   . Hypertension Mother   . Breast cancer Mother   . 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   Social History   Tobacco Use  . Smoking status: Never Smoker  . Smokeless tobacco: Never Used  Substance Use Topics  . Alcohol use: Yes    Alcohol/week: 6.0 standard drinks    Types: 6 Cans of beer per week    Current Meds  Medication Sig  . amLODipine (NORVASC) 5 MG tablet TAKE ONE (1) TABLET EACH DAY  . Cholecalciferol (VITAMIN D-3) 25 MCG (1000 UT) CAPS Take 5,000 mcg by mouth daily at 12 noon.   . Omega-3-6-9 CAPS Take 1 capsule by mouth daily.   Marland Kitchen thyroid (NP THYROID) 15 MG tablet TAKE ONE (1) TABLET EACH DAY  . vitamin C (ASCORBIC ACID) 500 MG tablet Take 500 mg by mouth daily.  . Vitamin E 100 units TABS Take 125 mcg by mouth.  . [DISCONTINUED] thyroid (NP THYROID) 15 MG tablet TAKE ONE (1) TABLET EACH DAY      Depression screen [facility] 2/9 11/29/2019 05/28/2019  Decreased Interest 0 0  Down, Depressed, Hopeless 0 0  PHQ - 2 Score 0 0     Objective:   Today's Vitals: BP 120/76   Pulse 79   Temp (!)  97.5 F (36.4 C) (Temporal)   Ht 5' 3.5" (1.613 m)   Wt 123 lb (55.8 kg)   SpO2 98%   BMI 21.45 kg/m  Vitals with BMI 06/10/2020 02/04/2020 11/29/2019  Height 5' 3.5" _0  -  Weight 123 lbs 122 lbs -  BMI 00.12 39.3 -  Systolic 594 090 502  Diastolic 76 70 80  Pulse 79 60 -     Physical Exam  She looks systemically well.  Blood pressure is excellent.  She is alert and orientated without any focal neurological signs.     Assessment   1. Essential hypertension, benign   2. Vitamin D deficiency disease   3. Mixed hyperlipidemia   4. Urinary tract infection without hematuria, site unspecified       Tests ordered Orders Placed This Encounter  Procedures  . Urinalysis w microscopic + reflex cultur      Plan: 1. She will continue with amlodipine for hypertension which is controlling her blood pressure. 2. She will continue with desiccated NP thyroid for symptoms of thyroid dysfunction.  I have refilled this today. 3. I will check a urine for urinalysis and possible culture to see if she has UTI. 4. I discussed COVID-19 vaccination and the science behind it.  I think she will not get the vaccine. 5. Follow-up with Judson Roch in 6 months for an annual Medicare wellness visit.   Meds ordered this encounter  Medications  . thyroid (NP THYROID) 15 MG tablet    Sig: TAKE ONE (1) TABLET EACH DAY    Dispense:  30 tablet    Refill:  3    Shrihan Putt Luther Parody, MD

## 2020-06-10 NOTE — Addendum Note (Signed)
Addended by: Anibal Henderson on: 06/10/2020 10:43 AM   Modules accepted: Orders

## 2020-06-11 LAB — URINALYSIS W MICROSCOPIC + REFLEX CULTURE
Bacteria, UA: NONE SEEN /HPF
Bilirubin Urine: NEGATIVE
Glucose, UA: NEGATIVE
Hgb urine dipstick: NEGATIVE
Hyaline Cast: NONE SEEN /LPF
Ketones, ur: NEGATIVE
Leukocyte Esterase: NEGATIVE
Nitrites, Initial: NEGATIVE
Protein, ur: NEGATIVE
RBC / HPF: NONE SEEN /HPF (ref 0–2)
Specific Gravity, Urine: 1.002 (ref 1.001–1.03)
Squamous Epithelial / HPF: NONE SEEN /HPF (ref <=5)
WBC, UA: NONE SEEN /HPF (ref 0–5)
pH: 7 (ref 5.0–8.0)

## 2020-06-11 LAB — NO CULTURE INDICATED

## 2020-06-24 ENCOUNTER — Other Ambulatory Visit: Payer: Self-pay

## 2020-06-24 ENCOUNTER — Encounter (INDEPENDENT_AMBULATORY_CARE_PROVIDER_SITE_OTHER): Payer: Self-pay | Admitting: Nurse Practitioner

## 2020-06-24 ENCOUNTER — Telehealth (INDEPENDENT_AMBULATORY_CARE_PROVIDER_SITE_OTHER): Payer: Medicare HMO | Admitting: Nurse Practitioner

## 2020-06-24 VITALS — Ht 63.5 in | Wt 123.0 lb

## 2020-06-24 DIAGNOSIS — B373 Candidiasis of vulva and vagina: Secondary | ICD-10-CM

## 2020-06-24 DIAGNOSIS — R1032 Left lower quadrant pain: Secondary | ICD-10-CM | POA: Diagnosis not present

## 2020-06-24 DIAGNOSIS — B3731 Acute candidiasis of vulva and vagina: Secondary | ICD-10-CM

## 2020-06-24 MED ORDER — AMOXICILLIN-POT CLAVULANATE 875-125 MG PO TABS: 1.0000 | ORAL_TABLET | Freq: Two times a day (BID) | ORAL | 0 refills | Status: DC

## 2020-06-24 MED ORDER — FLUCONAZOLE 150 MG PO TABS: 150.0000 mg | ORAL_TABLET | Freq: Once | ORAL | 0 refills | Status: AC

## 2020-06-24 NOTE — Progress Notes (Signed)
Due to national recommendations of social distancing related to the [place] pandemic, an audio-only tele-health visit was felt to be the most appropriate encounter type for this patient today. I connected with  Heidi Leblanc on 06/24/20 utilizing audio-only technology and verified that I am speaking with the correct person using two identifiers. The patient was located at their mother's home, and I was located at the office of [facility] during the encounter. I discussed the limitations of evaluation and management by telemedicine. The patient expressed understanding and agreed to proceed.    Subjective:  Patient ID: Heidi Leblanc, female    DOB: 05-14-1951  Age: 69 y.o. MRN: [id number]  CC:  Chief Complaint  Patient presents with  . Abdominal Pain    Left lower abdominal discomfort, states she is having diverticulitis flare up, started.Sunday      HPI  This patient arrives today for the above.  She tells me she has been experiencing left lower abdominal pain for approximately 3 days.  She tells me she has a history of diverticulitis and her symptoms are consistent with previous flares.  She would like to discuss treatment for this.  She denies any nausea or vomiting, fevers, or bloody stool.  She describes the pain as a discomfort and rates it as an 8/10 intensity.  Past Medical History:  Diagnosis Date  . Anxiety   . 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 Diverticulosis   . Fibroadenoma    Right breast  . HLD (hyperlipidemia) 07/17/2019  . Hypertension   . Right arm numbness   . Shingles       Family History  Problem Relation Age of Onset  . Colon cancer Father   . Cancer Mother   . Hypertension Mother   . Breast cancer Mother   . 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   Social History   Tobacco Use  . Smoking status: Never Smoker  . Smokeless tobacco: Never Used  Substance Use Topics  . Alcohol use: Yes    Alcohol/week: 6.0 standard drinks    Types: 6 Cans of beer per week     Current Meds  Medication Sig  . amLODipine (NORVASC) 5 MG tablet TAKE ONE (1) TABLET EACH DAY  . Cholecalciferol (VITAMIN D-3) 25 MCG (1000 UT) CAPS Take 5,000 mcg by mouth daily at 12 noon.   . Omega-3-6-9 CAPS Take 1 capsule by mouth daily.   Marland Kitchen thyroid (NP THYROID) 15 MG tablet TAKE ONE (1) TABLET EACH DAY  . vitamin C (ASCORBIC ACID) 500 MG tablet Take 500 mg by mouth daily.  . Vitamin E 100 units TABS Take 125 mcg by mouth.    ROS:  See HPI   Objective:   Today's Vitals: Ht 5' 3.5" (1.613 m)   Wt 123 lb (55.8 kg)   BMI 21.45 kg/m  Vitals with BMI 06/24/2020 06/10/2020 02/04/2020  Height 5' 3.5" 5' 3.5" 5\' 6"   Weight 123 lbs 123 lbs 122 lbs  BMI 21.44 37.10 62.6  Systolic - 948 546  Diastolic - 76 70  Pulse - 79 60     Physical Exam Comprehensive physical exam not conducted today as office visit  was conducted remotely.  Over the phone patient sounded well, she is able to speak in complete sentences and did not sound like she was in excruciating pain while talking to her.  She was alert and oriented and appeared to have appropriate judgment and thought processes.      Assessment and Plan   1. Left lower quadrant abdominal pain   2. Vaginal yeast infection      Plan: 1.  Probable diverticulitis.  Will treat empirically with antibiotics.  We discussed the differences between possible medication regimens and patient elected to try Augmentin.  I do see that she has an allergy to cephalexin but tells me she has taken both amoxicillin and penicillin in the past without any issues.  I did discuss that there is always a  risk of cross sensitivity allergic reaction between cephalosporins and Augmentin thus if she experiences any rash or hives she needs to stop the antibiotic and notify me.  She tells me she understands.  Also discussed that she may want to take Augmentin with a small amount of food to help protect her stomach and she may want to consider taking a probiotic or eating a probiotic yogurt daily while taking the Augmentin.  She tells me she understands.  She was told if her symptoms persist into next week or worsen over the next couple of days she should let us know immediately.  She tells me she understands. 2.  She did mention that she will sometimes get a vaginal yeast infection when she takes antibiotics I will prescribe her Diflucan that she can take as needed if she experiences any symptoms of vaginal yeast infection.   Tests ordered No orders of the defined types were placed in this encounter.     Meds ordered this encounter  Medications  . amoxicillin-clavulanate (AUGMENTIN) 875-125 MG tablet    Sig: Take 1 tablet by mouth 2 (two) times daily.    Dispense:  20 tablet    Refill:  0    Order Specific Question:   Supervising Provider    Answer:   Hurshel Party C [[id number]]  . fluconazole (DIFLUCAN) 150 MG tablet    Sig: Take 1 tablet (150 mg total) by mouth once for 1 dose. As needed for vaginal itching.    Dispense:  1 tablet    Refill:  0    Order Specific Question:   Supervising Provider    Answer:   Doree Albee [[id number]]    Patient to follow-up as scheduled or sooner as needed.  This telephone conversation lasted for 8 minutes and 53 seconds.  Ailene Ards, NP

## 2020-06-24 NOTE — Patient Instructions (Signed)
Amoxicillin; Clavulanic Acid Chewable Tablets What is this medicine? AMOXICILLIN; CLAVULANIC ACID (a mox i SIL in; KLAV yoo lan ic AS id) is a penicillin antibiotic. It treats some infections caused by bacteria. It will not work for colds, the flu, or other viruses. This medicine may be used for other purposes; ask your health care provider or pharmacist if you have questions. COMMON BRAND NAME(S): Augmentin What should I tell my health care provider before I take this medicine? They need to know if you have any of these conditions:  bowel disease, like colitis  kidney disease  liver disease  mononucleosis  phenylketonuria  an unusual or allergic reaction to amoxicillin, penicillin, cephalosporin, other antibiotics, clavulanic acid, other medicines, foods, dyes, or preservatives  pregnant or trying to get pregnant  breast-feeding How should I use this medicine? Take this drug by mouth. Take it as directed on the prescription label at the same time every day. Chew or crush it completely before swallowing. Do not swallow tablets whole. You can take it with or without food. If it upsets your stomach, take it with food. Take all of this drug unless your health care provider tells you to stop it early. Keep taking it even if you think you are better. Talk to your health care provider about the use of this drug in children. While it may be prescribed for selected conditions, precautions do apply. Overdosage: If you think you have taken too much of this medicine contact a poison control center or emergency room at once. NOTE: This medicine is only for you. Do not share this medicine with others. What if I miss a dose? If you miss a dose, take it as soon as you can. If it is almost time for your next dose, take only that dose. Do not take double or extra doses. What may interact with this medicine?  allopurinol  anticoagulants  birth control pills  methotrexate  probenecid This list  may not describe all possible interactions. Give your health care provider a list of all the medicines, herbs, non-prescription drugs, or dietary supplements you use. Also tell them if you smoke, drink alcohol, or use illegal drugs. Some items may interact with your medicine. What should I watch for while using this medicine? Tell your doctor or healthcare provider if your symptoms do not improve. This medicine may cause serious skin reactions. They can happen weeks to months after starting the medicine. Contact your healthcare provider right away if you notice fevers or flu-like symptoms with a rash. The rash may be red or purple and then turn into blisters or peeling of the skin. Or, you might notice a red rash with swelling of the face, lips or lymph nodes in your neck or under your arms. Do not treat diarrhea with over the counter products. Contact your doctor if you have diarrhea that lasts more than 2 days or if it is severe and watery. If you have diabetes, you may get a false-positive result for sugar in your urine. Check with your doctor or healthcare provider. Birth control pills may not work properly while you are taking this medicine. Talk to your doctor about using an extra method of birth control. What side effects may I notice from receiving this medicine? Side effects that you should report to your doctor or health care professional as soon as possible:  allergic reactions like skin rash, itching or hives, swelling of the face, lips, or tongue  breathing problems  dark urine  fever or chills, sore throat  redness, blistering, peeling, or loosening of the skin, including inside the mouth  seizures  trouble passing urine or change in the amount of urine  unusual bleeding, bruising  unusually weak or tired  white patches or sores in the mouth or throat Side effects that usually do not require medical attention (report to your doctor or health care professional if they  continue or are bothersome):  diarrhea  dizziness  headache  nausea, vomiting  stomach upset  vaginal or anal irritation This list may not describe all possible side effects. Call your doctor for medical advice about side effects. You may report side effects to FDA at 1-800-FDA-1088. Where should I keep my medicine? Keep out of the reach of children and pets. Store at room temperature between 20 and 25 degrees C (68 and 77 degrees F). Throw away any unused drug after the expiration date. NOTE: This sheet is a summary. It may not cover all possible information. If you have questions about this medicine, talk to your doctor, pharmacist, or health care provider.  2020 Elsevier/Gold Standard (2019-04-15 08:58:41)   Diverticulitis  Diverticulitis is when small pockets in your large intestine (colon) get infected or swollen. This causes stomach pain and watery poop (diarrhea). These pouches are called diverticula. They form in people who have a condition called diverticulosis. Follow these instructions at home: Medicines  Take over-the-counter and prescription medicines only as told by your doctor. These include: ? Antibiotics. ? Pain medicines. ? Fiber pills. ? Probiotics. ? Stool softeners.  Do not drive or use heavy machinery while taking prescription pain medicine.  If you were prescribed an antibiotic, take it as told. Do not stop taking it even if you feel better. General instructions   Follow a diet as told by your doctor.  When you feel better, your doctor may tell you to change your diet. You may need to eat a lot of fiber. Fiber makes it easier to poop (have bowel movements). Healthy foods with fiber include: ? Berries. ? Beans. ? Lentils. ? Green vegetables.  Exercise 3 or more times a week. Aim for 30 minutes each time. Exercise enough to sweat and make your heart beat faster.  Keep all follow-up visits as told. This is important. You may need to have an exam  of the large intestine. This is called a colonoscopy. Contact a doctor if:  Your pain does not get better.  You have a hard time eating or drinking.  You are not pooping like normal. Get help right away if:  Your pain gets worse.  Your problems do not get better.  Your problems get worse very fast.  You have a fever.  You throw up (vomit) more than one time.  You have poop that is: ? Bloody. ? Black. ? Tarry. Summary  Diverticulitis is when small pockets in your large intestine (colon) get infected or swollen.  Take medicines only as told by your doctor.  Follow a diet as told by your doctor. This information is not intended to replace advice given to you by your health care provider. Make sure you discuss any questions you have with your health care provider. Document Revised: 07/14/2017 Document Reviewed: 08/18/2016 Elsevier Patient Education  [place].   Fluconazole tablets What is this medicine? FLUCONAZOLE (floo KON na zole) is an antifungal medicine. It is used to treat certain kinds of fungal or yeast infections. This medicine may be used for other purposes; ask  your health care provider or pharmacist if you have questions. COMMON BRAND NAME(S): Diflucan What should I tell my health care provider before I take this medicine? They need to know if you have any of these conditions:  history of irregular heart beat  kidney disease  an unusual or allergic reaction to fluconazole, other azole antifungals, medicines, foods, dyes, or preservatives  pregnant or trying to get pregnant  breast-feeding How should I use this medicine? Take this medicine by mouth. Follow the directions on the prescription label. Do not take your medicine more often than directed. Talk to your pediatrician regarding the use of this medicine in children. Special care may be needed. This medicine has been used in children as young as 2 months of age. Overdosage: If you think  you have taken too much of this medicine contact a poison control center or emergency room at once. NOTE: This medicine is only for you. Do not share this medicine with others. What if I miss a dose? If you miss a dose, take it as soon as you can. If it is almost time for your next dose, take only that dose. Do not take double or extra doses. What may interact with this medicine? Do not take this medicine with any of the following medications:  astemizole  certain medicines for irregular heart beat like dronedarone, quinidine  cisapride  erythromycin  lomitapide  other medicines that prolong the QT interval (cause an abnormal heart rhythm)  pimozide  terfenadine  thioridazine This medicine may also interact with the following medications:  antiviral medicines for HIV or AIDS  birth control pills  certain antibiotics like rifabutin, rifampin  certain medicines for blood pressure like amlodipine, isradipine, felodipine, hydrochlorothiazide, losartan, nifedipine  certain medicines for cancer like cyclophosphamide, ibrutinib, vinblastine, vincristine  certain medicines for cholesterol like atorvastatin, lovastatin, fluvastatin, simvastatin  certain medicines for depression, anxiety, or psychotic disturbances like amitriptyline, midazolam, nortriptyline, triazolam  certain medicines for diabetes like glipizide, glyburide, tolbutamide  certain medicines for pain like alfentanil, fentanyl, methadone  certain medicines for seizures like carbamazepine, phenytoin  certain medicines that treat or prevent blood clots like warfarin  dofetilide  halofantrine  medicines that lower your chance of fighting infection like cyclosporine, prednisone, tacrolimus  NSAIDS, medicines for pain and inflammation, like celecoxib, diclofenac, flurbiprofen, ibuprofen, meloxicam, naproxen  other medicines for fungal  infections  sirolimus  theophylline  tofacitinib  tolvaptan  ziprasidone This list may not describe all possible interactions. Give your health care provider a list of all the medicines, herbs, non-prescription drugs, or dietary supplements you use. Also tell them if you smoke, drink alcohol, or use illegal drugs. Some items may interact with your medicine. What should I watch for while using this medicine? Visit your doctor or health care professional for regular checkups. If you are taking this medicine for a long time you may need blood work. Tell your doctor if your symptoms do not improve. Some fungal infections need many weeks or months of treatment to cure. Alcohol can increase possible damage to your liver. Avoid alcoholic drinks. If you have a vaginal infection, do not have sex until you have finished your treatment. You can wear a sanitary napkin. Do not use tampons. Wear freshly washed cotton, not synthetic, panties. What side effects may I notice from receiving this medicine? Side effects that you should report to your doctor or health care professional as soon as possible:  allergic reactions like skin rash or itching, hives, swelling of  the lips, mouth, tongue, or throat  dark urine  feeling dizzy or faint  irregular heartbeat or chest pain  redness, blistering, peeling or loosening of the skin, including inside the mouth  trouble breathing  unusual bruising or bleeding  vomiting  yellowing of the eyes or skin Side effects that usually do not require medical attention (report to your doctor or health care professional if they continue or are bothersome):  changes in how food tastes  diarrhea  headache  stomach upset or nausea This list may not describe all possible side effects. Call your doctor for medical advice about side effects. You may report side effects to FDA at 1-800-FDA-1088. Where should I keep my medicine? Keep out of the reach of  children. Store at room temperature below 30 degrees C (86 degrees F). Throw away any medicine after the expiration date. NOTE: This sheet is a summary. It may not cover all possible information. If you have questions about this medicine, talk to your doctor, pharmacist, or health care provider.  2020 Elsevier/Gold Standard (2019-04-25 11:41:56)

## 2020-07-08 ENCOUNTER — Other Ambulatory Visit: Payer: Self-pay

## 2020-07-08 ENCOUNTER — Ambulatory Visit (INDEPENDENT_AMBULATORY_CARE_PROVIDER_SITE_OTHER): Payer: Medicare HMO | Admitting: Gastroenterology

## 2020-07-08 ENCOUNTER — Encounter (INDEPENDENT_AMBULATORY_CARE_PROVIDER_SITE_OTHER): Payer: Self-pay | Admitting: Gastroenterology

## 2020-07-08 DIAGNOSIS — K582 Mixed irritable bowel syndrome: Secondary | ICD-10-CM | POA: Diagnosis not present

## 2020-07-08 DIAGNOSIS — K589 Irritable bowel syndrome without diarrhea: Secondary | ICD-10-CM | POA: Insufficient documentation

## 2020-07-08 MED ORDER — DICYCLOMINE HCL 10 MG PO CAPS: 10.0000 mg | ORAL_CAPSULE | Freq: Two times a day (BID) | ORAL | 1 refills | Status: DC | PRN

## 2020-07-08 NOTE — Progress Notes (Signed)
Maylon Peppers, M.D. Gastroenterology & Hepatology [facility] [address] [place], [place] [address]  Primary Care Physician: Ailene Ards, NP [place] [address]  I will communicate my assessment and recommendations to the referring MD via EMR. "Note: Occasional unusual wording and randomly placed punctuation marks may result from the use of speech recognition technology to transcribe this document"  Problems: 1. IBS-M  History of Present Illness: Heidi Leblanc is a 69 y.o. female with PMH HTN, HLD, hypothyroidism, anxiety, diverticulosis, and IBS, who presents for follow up of abdominal pain.  Patient reports that for the last week she has presented cramping in the RLQ, but states that the pain migrated to the LLQ for a few days. Due to this she was concerned of recurrent episodes of diverticulitis and was prescribed Augmentin for 7 days by her PCP. States that the pain resolved after taking the antibiotics but has felt persistent pressure in her lower abdomen. She has not taken any medication for pain control. Patient reports that she had some watery bowel movements with some incontinence for the last week. She has had one bowel movement per day on average, which can arrive between hard to watery.  In fact, she states that 1 day she had some constipation.  She reports that she took Green's powder for constipation for the last 2 days and has had some sensation of possible fecal incontinence since then. Yesterday she had one loose bowel movements.  The patient reports that she has had intermittent episodes of watery diarrhea in the past for multiple years which he has been attributed to IBS.  The patient denies having any nausea, vomiting, fever, chills, hematochezia, melena, hematemesis, abdominal distention, abdominal pain, diarrhea, jaundice, pruritus or weight loss.   Last Colonoscopy: 05/30/2019 -diverticulosis,  hemorrhoids  FHx: neg for any gastrointestinal/liver disease,mother breast cancer at age 67, father was diagnosed at 55 had advanced cancer upon diagnosis Social: neg smoking, or illicit drug use. Drinks 1 beer a day. Surgical: umbilical hernia, hysterectomy  Past Medical History: Past Medical History:  Diagnosis Date  . Anxiety   . 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 Diverticulosis   . Fibroadenoma    Right breast  . HLD (hyperlipidemia) 07/17/2019  . Hypertension   . Right arm numbness   . Shingles     Past Surgical History: Past Surgical History:  Procedure Laterality Date  . ABDOMINAL HYSTERECTOMY    . BREAST CYST ASPIRATION Left 2004  . COLONOSCOPY  07/21/2011   Procedure: COLONOSCOPY;  Surgeon: Rogene Houston, MD;  Location: [facility];  Service: Endoscopy;  Laterality: N/A;  . COLONOSCOPY N/A 05/30/2019   Procedure: COLONOSCOPY;  Surgeon: Rogene Houston, MD;  Location: [facility];  Service: Endoscopy;  Laterality: N/A;  830  . HERNIA REPAIR    . TUBAL LIGATION      Family History: Family History  Problem Relation Age of Onset  . Colon cancer Father   . Cancer Mother   . Hypertension Mother   . Breast cancer Mother   . Hypertension Sister     Social History: Social History   Tobacco Use  Smoking Status Never Smoker  Smokeless Tobacco Never Used   Social History   Substance and Sexual Activity  Alcohol Use Yes  . Alcohol/week: 6.0 standard drinks  . Types: 6 Cans  of beer per week   Social History   Substance and Sexual Activity  Drug Use No    Allergies: Allergies  Allergen Reactions  . Cephalexin Hives    Medications: Current Outpatient Medications  Medication Sig Dispense Refill  . amLODipine (NORVASC) 5 MG tablet TAKE ONE (1) TABLET EACH DAY 90 tablet 0  . Cholecalciferol (VITAMIN D-3) 25 MCG (1000 UT) CAPS Take 5,000 mcg by  mouth daily at 12 noon.     . NON FORMULARY Take by mouth daily. Be Well Greens powder dissolve in water.    . Omega-3-6-9 CAPS Take 1 capsule by mouth daily.     Marland Kitchen thyroid (NP THYROID) 15 MG tablet TAKE ONE (1) TABLET EACH DAY 30 tablet 3  . vitamin C (ASCORBIC ACID) 500 MG tablet Take 500 mg by mouth daily.    . Vitamin E 100 units TABS Take 125 mcg by mouth.    . zinc gluconate 50 MG tablet Take 50 mg by mouth 3 (three) times a week.     No current facility-administered medications for this visit.    Review of Systems: GENERAL: negative for malaise, night sweats HEENT: No changes in hearing or vision, no nose bleeds or other nasal problems. NECK: Negative for lumps, goiter, pain and significant neck swelling RESPIRATORY: Negative for cough, wheezing CARDIOVASCULAR: Negative for chest pain, leg swelling, palpitations, orthopnea GI: SEE HPI MUSCULOSKELETAL: Negative for joint pain or swelling, back pain, and muscle pain. SKIN: Negative for lesions, rash PSYCH: Negative for sleep disturbance, mood disorder and recent psychosocial stressors. HEMATOLOGY Negative for prolonged bleeding, bruising easily, and swollen nodes. ENDOCRINE: Negative for cold or heat intolerance, polyuria, polydipsia and goiter. NEURO: negative for tremor, gait imbalance, syncope and seizures. The remainder of the review of systems is noncontributory.   Physical Exam: BP (!) 167/93 (BP Location: Right Arm, Patient Position: Sitting, Cuff Size: Large)   Pulse 86   Temp (!) 97.4 F (36.3 C) (Oral)   Ht 5' 3.5" (1.613 m)   Wt 123 lb (55.8 kg)   BMI 21.45 kg/m  GENERAL: The patient is AO x3, in no acute distress. HEENT: Head is normocephalic and atraumatic. EOMI are intact. Mouth is well hydrated and without lesions. NECK: Supple. No masses LUNGS: Clear to auscultation. No presence of rhonchi/wheezing/rales. Adequate chest expansion HEART: RRR, normal s1 and s2. ABDOMEN: Soft, nontender, no guarding, no  peritoneal signs, and nondistended. BS +. No masses. EXTREMITIES: Without any cyanosis, clubbing, rash, lesions or edema. NEUROLOGIC: AOx3, no focal motor deficit. SKIN: no jaundice, no rashes  Imaging/Labs: as above  I personally reviewed and interpreted the available labs, imaging and endoscopic files.  Impression and Plan: Heidi Leblanc is a 69 y.o. female with PMH HTN, HLD, hypothyroidism, anxiety, diverticulosis, and IBS, who presents for follow up of abdominal pain.  The patient has presented recent onset of symptoms consistent of mild discomfort and changing her bowel movement frequency and consistency.  However, she has not presented any red flag signs.  She did not improved substantially after taking antibiotic recently.  I will consider she is currently having a gastrointestinal infection but it is likely she has a flare of her irritable bowel syndrome, which I explained to the patient.  For now, we will recommend conservative measures with dicyclomine and Imodium as needed for diarrhea.  If the symptoms persist or worsen, we will need to order further testing such as stool testing for C. difficile and GI pathogen, as well as possibly a  CT abdomen.  Patient understood and agreed.  - Start Bentyl 10 mg as needed every 12 hours for abdominal cramping/pressure - Can take Imodium 1 tablet a day if having fecal soiling or persistent diarrhea   All questions were answered.      Harvel Quale, MD Gastroenterology and Hepatology [facility]

## 2020-07-08 NOTE — Patient Instructions (Addendum)
Start Bentyl 10 mg as needed every 12 hours for abdominal cramping/pressure Can take Imodium 1 tablet a day if having fecal soiling or persistent diarrhea  Call back if you have worsening symptoms, will need to perform stool testing and CT abdomen

## 2020-07-22 ENCOUNTER — Other Ambulatory Visit (INDEPENDENT_AMBULATORY_CARE_PROVIDER_SITE_OTHER): Payer: Self-pay | Admitting: Internal Medicine

## 2020-09-16 ENCOUNTER — Encounter (INDEPENDENT_AMBULATORY_CARE_PROVIDER_SITE_OTHER): Payer: Self-pay | Admitting: Internal Medicine

## 2020-10-01 ENCOUNTER — Ambulatory Visit (INDEPENDENT_AMBULATORY_CARE_PROVIDER_SITE_OTHER): Payer: Medicare HMO | Admitting: Gastroenterology

## 2020-10-05 ENCOUNTER — Ambulatory Visit (INDEPENDENT_AMBULATORY_CARE_PROVIDER_SITE_OTHER): Payer: Medicare HMO | Admitting: Gastroenterology

## 2020-10-14 ENCOUNTER — Encounter (INDEPENDENT_AMBULATORY_CARE_PROVIDER_SITE_OTHER): Payer: Self-pay | Admitting: Nurse Practitioner

## 2020-10-14 ENCOUNTER — Other Ambulatory Visit: Payer: Self-pay

## 2020-10-14 ENCOUNTER — Telehealth (INDEPENDENT_AMBULATORY_CARE_PROVIDER_SITE_OTHER): Payer: Medicare HMO | Admitting: Nurse Practitioner

## 2020-10-14 VITALS — Ht 63.5 in

## 2020-10-14 DIAGNOSIS — R197 Diarrhea, unspecified: Secondary | ICD-10-CM | POA: Diagnosis not present

## 2020-10-14 MED ORDER — LOPERAMIDE HCL 2 MG PO TABS: ORAL_TABLET | ORAL | 0 refills | Status: DC

## 2020-10-14 NOTE — Progress Notes (Signed)
An audio-only tele-health visit was conducted today. I connected with  Heidi Leblanc on 10/14/20 utilizing audio-only technology and verified that I am speaking with the correct person using two identifiers. The patient was located at their home, and I was located at the office of [facility] during the encounter. I discussed the limitations of evaluation and management by telemedicine. The patient expressed understanding and agreed to proceed.   Subjective:  Patient ID: Heidi Leblanc, female    DOB: 30-May-1951  Age: 70 y.o. MRN: [id number]  CC:  Chief Complaint  Patient presents with  . Nausea    Nausea, vomiting, diarrhea, started yesterday around 3pm, ate BBQ earlier that day      HPI  This patient arrives today for virtual visit for the above.  She tells me yesterday after lunch she started experiencing nausea and vomiting as well as abdominal pain.  This eventually then progressed to also experiencing some diarrhea.  She tells me that she thinks this might be related to the barbecue that she ate at lunchtime yesterday.  She does have a granddaughter who became nauseous and threw up this past weekend, but otherwise has not been around any other sick contacts.  She tells me the granddaughter did not continue to experience any symptoms and is already improved.  She has taken over-the-counter antidiarrheal this morning which seemed to not do anything to improve her symptoms.  She is not sure of the name of the medication.  She tells me that she is no longer experiencing any nausea or vomiting and is able to eat and drink.  She denies any fevers or chills.  She denies any blood in her stool or black tarry stools.  She tells me she is spearing seeing some abdominal pain but the pain is improving and seems to be moving downward through her abdomen over time.  Past Medical History:  Diagnosis Date  . Anxiety   . 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 Diverticulosis   . Fibroadenoma    Right breast  . HLD (hyperlipidemia) 07/17/2019  . Hypertension   . Right arm numbness   . Shingles       Family History  Problem Relation Age of Onset  . Colon cancer Father   . Cancer Mother   . Hypertension Mother   . Breast cancer Mother   . 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   Social History   Tobacco Use  . Smoking status: Never Smoker  . Smokeless tobacco: Never Used  Substance Use Topics  . Alcohol use: Yes    Alcohol/week: 6.0 standard drinks    Types: 6 Cans of beer per week     Current Meds  Medication Sig  . amLODipine (NORVASC) 5 MG tablet TAKE ONE (1) TABLET EACH DAY  . Cholecalciferol (VITAMIN D-3) 25 MCG (1000 UT) CAPS Take 5,000 mcg by mouth daily at 12 noon.   . dicyclomine (BENTYL) 10 MG capsule Take 1 capsule (10 mg total) by mouth every 12 (twelve) hours as needed for spasms.  . NON FORMULARY Take by mouth daily. Be Well Greens powder dissolve in water.  . Omega-3-6-9 CAPS Take 1 capsule  by mouth daily.   Marland Kitchen thyroid (NP THYROID) 15 MG tablet TAKE ONE (1) TABLET EACH DAY  . vitamin C (ASCORBIC ACID) 500 MG tablet Take 500 mg by mouth daily.  . Vitamin E 100 units TABS Take 125 mcg by mouth.  . zinc gluconate 50 MG tablet Take 50 mg by mouth 3 (three) times a week.    ROS:  Review of Systems  Constitutional: Negative for chills and fever.  Gastrointestinal: Positive for abdominal pain, diarrhea, nausea and vomiting. Negative for blood in stool and melena.     Objective:   Today's Vitals: Ht 5' 3.5" (1.613 m)   BMI 21.45 kg/m  Vitals with BMI 10/14/2020 07/08/2020 06/24/2020  Height 5' 3.5" 5' 3.5" 5' 3.5"  Weight  (No Data) 123 lbs 123 lbs  BMI - 03.15 94.58  Systolic (No Data) 592 -  Diastolic (No Data) 93 -  Pulse (No Data) 86 -     Physical Exam Comprehensive physical exam not completed today as office visit was conducted remotely.  Patient sounded well over the phone she answered questions appropriately.  Patient was alert and oriented, and appeared to have appropriate judgment.       Assessment and Plan   1. Diarrhea, unspecified type      Plan: 1.  Very well could be diarrhea secondary to acute food poisoning.  Low suspicion for C. difficile as patient is not experiencing any significant or severe abdominal pain in his [place] parenting any fever.  She also has not seen any blood in her stool.  We will treat with over-the-counter Imodium, and she was told to let me know tomorrow if her symptoms have not improved.  At that point may need to consider having patient come in to provide stool sample so that way we can test for C. Difficile.  She does not feel she needs anything for the nausea or vomiting at this time.  She is encouraged to let me know if symptoms progress or do not improve.   Tests ordered No orders of the defined types were placed in this encounter.     No orders of the defined types were placed in this encounter.   Patient to follow-up as scheduled in April or sooner as needed.  Total time spent on telephone today was 7 minutes and 6 seconds.  Ailene Ards, NP

## 2020-11-09 ENCOUNTER — Other Ambulatory Visit (INDEPENDENT_AMBULATORY_CARE_PROVIDER_SITE_OTHER): Payer: Self-pay | Admitting: Nurse Practitioner

## 2020-12-05 ENCOUNTER — Other Ambulatory Visit (INDEPENDENT_AMBULATORY_CARE_PROVIDER_SITE_OTHER): Payer: Self-pay | Admitting: Internal Medicine

## 2020-12-09 ENCOUNTER — Encounter (INDEPENDENT_AMBULATORY_CARE_PROVIDER_SITE_OTHER): Payer: Medicare HMO | Admitting: Nurse Practitioner

## 2020-12-14 ENCOUNTER — Other Ambulatory Visit (INDEPENDENT_AMBULATORY_CARE_PROVIDER_SITE_OTHER): Payer: Self-pay | Admitting: Internal Medicine

## 2020-12-16 DIAGNOSIS — H40013 Open angle with borderline findings, low risk, bilateral: Secondary | ICD-10-CM | POA: Diagnosis not present

## 2020-12-31 ENCOUNTER — Encounter (INDEPENDENT_AMBULATORY_CARE_PROVIDER_SITE_OTHER): Payer: Medicare HMO | Admitting: Nurse Practitioner

## 2021-01-05 ENCOUNTER — Other Ambulatory Visit (INDEPENDENT_AMBULATORY_CARE_PROVIDER_SITE_OTHER): Payer: Self-pay | Admitting: Gastroenterology

## 2021-01-05 DIAGNOSIS — K582 Mixed irritable bowel syndrome: Secondary | ICD-10-CM

## 2021-01-05 NOTE — Telephone Encounter (Signed)
Last seen 07/08/2020 for IBS with Dr. Jenetta Downer.

## 2021-01-19 DIAGNOSIS — Z6820 Body mass index (BMI) 20.0-20.9, adult: Secondary | ICD-10-CM | POA: Diagnosis not present

## 2021-01-19 DIAGNOSIS — Z01419 Encounter for gynecological examination (general) (routine) without abnormal findings: Secondary | ICD-10-CM | POA: Diagnosis not present

## 2021-01-27 ENCOUNTER — Encounter (INDEPENDENT_AMBULATORY_CARE_PROVIDER_SITE_OTHER): Payer: Self-pay | Admitting: Nurse Practitioner

## 2021-01-27 ENCOUNTER — Ambulatory Visit (INDEPENDENT_AMBULATORY_CARE_PROVIDER_SITE_OTHER): Payer: Medicare HMO | Admitting: Nurse Practitioner

## 2021-01-27 ENCOUNTER — Other Ambulatory Visit: Payer: Self-pay

## 2021-01-27 VITALS — BP 138/94 | HR 76 | Temp 97.9°F | Ht 63.0 in | Wt 117.4 lb

## 2021-01-27 DIAGNOSIS — Z0000 Encounter for general adult medical examination without abnormal findings: Secondary | ICD-10-CM | POA: Diagnosis not present

## 2021-01-27 DIAGNOSIS — E785 Hyperlipidemia, unspecified: Secondary | ICD-10-CM | POA: Diagnosis not present

## 2021-01-27 DIAGNOSIS — E559 Vitamin D deficiency, unspecified: Secondary | ICD-10-CM

## 2021-01-27 DIAGNOSIS — I10 Essential (primary) hypertension: Secondary | ICD-10-CM | POA: Diagnosis not present

## 2021-01-27 DIAGNOSIS — R5383 Other fatigue: Secondary | ICD-10-CM

## 2021-01-27 NOTE — Patient Instructions (Signed)
  Heidi Leblanc , Thank you for taking time to come for your Medicare Wellness Visit. I appreciate your ongoing commitment to your health goals. Please review the following plan we discussed and let me know if I can assist you in the future.   These are the goals we discussed:  Goals   None     This is a list of the screening recommended for you and due dates:  Health Maintenance  Topic Date Due   COVID-19 Vaccine (1) 02/12/2021*   Zoster (Shingles) Vaccine (2 of 2) 04/29/2021*   Pneumonia vaccines (2 of 2 - PPSV23) 01/27/2022*   Flu Shot  03/15/2021   Mammogram  06/05/2022   Tetanus Vaccine  08/14/2022   Colon Cancer Screening  05/29/2024   DEXA scan (bone density measurement)  Completed   Hepatitis C Screening: USPSTF Recommendation to screen - Ages 78-79 yo.  Completed   HPV Vaccine  Aged Out  *Topic was postponed. The date shown is not the original due date.

## 2021-01-27 NOTE — Progress Notes (Signed)
Subjective:   Heidi Leblanc is a 70 y.o. female who presents for Medicare Annual (Subsequent) preventive examination.  Review of Systems    Positive for fatigue, negative for chest pain/palpitations, negative for shortness of breath, she reports some abdominal pain a couple weeks ago but this has subsided.  Of note, blood work has not been collected in about 18 months.  She did have elevated AST and ALT on last blood results.  She does have history of hypertension and continues on amlodipine 5 mg daily.  She also has a history of hyperlipidemia and continues on omega-3 fish oil supplement daily.  She continues on vitamin D3 5000 IUs daily, and she is on thyroid medication for the off label treatment of her fatigue. Cardiac Risk Factors include: advanced age (>61men, >48 women);dyslipidemia;hypertension     Objective:    Today's Vitals   01/27/21 1319  BP: (!) 138/94  Pulse: 76  Temp: 97.9 F (36.6 C)  TempSrc: Temporal  SpO2: 99%  Weight: 117 lb 6.4 oz (53.3 kg)  Height: 5\' 3"  (1.6 m)   Body mass index is 20.8 kg/m.  Advanced Directives 01/27/2021 11/29/2019 05/30/2019 07/21/2011  Does Patient Have a Medical Advance Directive? Yes Yes No Patient does not have advance directive;Patient would like information  Type of Advance Directive Living will Living will - -  Does patient want to make changes to medical advance directive? No - Patient declined No - Patient declined - -  Would patient like information on creating a medical advance directive? - - Yes (MAU/Ambulatory/Procedural Areas - Information given) Advance directive packet given  Pre-existing out of facility DNR order (yellow form or pink MOST form) - - - No    Current Medications (verified) Outpatient Encounter Medications as of 01/27/2021  Medication Sig   amLODipine (NORVASC) 5 MG tablet TAKE ONE (1) TABLET EACH DAY   Cholecalciferol (VITAMIN D-3) 25 MCG (1000 UT) CAPS Take 5,000 mcg by mouth daily at 12 noon.     dicyclomine (BENTYL) 10 MG capsule TAKE ONE CAPSULE EVERY 12 HOURS AS NEEDED   NON FORMULARY Take by mouth daily. Be Well Greens powder dissolve in water.   NP THYROID 15 MG tablet TAKE ONE (1) TABLET EACH DAY   Omega-3-6-9 CAPS Take 1 capsule by mouth daily.    vitamin C (ASCORBIC ACID) 500 MG tablet Take 500 mg by mouth daily.   Vitamin E 100 units TABS Take 125 mcg by mouth.   zinc gluconate 50 MG tablet Take 50 mg by mouth 3 (three) times a week.   [DISCONTINUED] loperamide (IMODIUM A-D) 2 MG tablet Take 2 tablets by mouth once, then take 1 tablet by mouth after every loose stool. Do not exceed 4 tablets in 24 hours. Do not take longer than 48 hours.   No facility-administered encounter medications on file as of 01/27/2021.    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   right nare FT:DDUK   Diverticulosis    Fibroadenoma    Right breast   HLD (hyperlipidemia) 07/17/2019   Hypertension    Right arm numbness    Shingles    Past Surgical History:  Procedure Laterality Date   ABDOMINAL HYSTERECTOMY     BREAST CYST ASPIRATION Left 2004   COLONOSCOPY  07/21/2011   Procedure: COLONOSCOPY;  Surgeon: Rogene Houston,  MD;  Location: [facility];  Service: Endoscopy;  Laterality: N/A;   COLONOSCOPY N/A 05/30/2019   Procedure: COLONOSCOPY;  Surgeon: Rogene Houston, MD;  Location: [facility];  Service: Endoscopy;  Laterality: N/A;  55   HERNIA REPAIR     TUBAL LIGATION     Family History  Problem Relation Age of Onset   Colon cancer Father    Cancer Mother    Hypertension Mother    Breast cancer Mother    Hypertension Sister    Social History   Socioeconomic History   Marital status: Divorced    Spouse name: Not on file   Number of children: Not on file   Years of education: Not on file   Highest  education level: Not on file  Occupational History   Not on file  Tobacco Use   Smoking status: Never   Smokeless tobacco: Never  Vaping Use   Vaping Use: Never used  Substance and Sexual Activity   Alcohol use: Yes    Alcohol/week: 6.0 standard drinks    Types: 6 Cans of beer per week   Drug use: No   Sexual activity: Not on file  Other Topics Concern   Not on file  Social History Narrative   Divorced for 52 years,was married for 39 years.Lives alone.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   Social Determinants of Health   Financial Resource Strain: Not on file  Food Insecurity: Not on file  Transportation Needs: Not on file  Physical Activity: Not on file  Stress: Not on file  Social Connections: Not on file    Tobacco Counseling Counseling given: Yes   Clinical Intake:  Pre-visit preparation completed: Yes  Pain : No/denies pain     BMI - recorded: 20.8 Nutritional Status: BMI of 19-24  Normal Nutritional Risks: None Diabetes: No  How often do you need to have someone help you when you read instructions, pamphlets, or other written materials from your doctor or pharmacy?: 1 - Never What is the last grade level you completed in school?: Some College  Diabetic?No     Information entered by :: Jeralyn Ruths, NP-C   Activities of Daily Living In your present state of health, do you have any difficulty performing the following activities: 01/27/2021  Hearing? N  Vision? N  Difficulty concentrating or making decisions? N  Walking or climbing stairs? N  Dressing or bathing? N  Doing errands, shopping? N  Preparing Food and eating ? N  Using the Toilet? N  In the past six months, have you accidently leaked urine? Y  Do you have problems with loss of bowel control? N  Managing your Medications? N  Managing your Finances? N  Housekeeping or managing your Housekeeping? N   Some recent data might be hidden    Patient Care Team: Ailene Ards, NP as PCP - General (Nurse Practitioner) Maisie Fus, MD as Attending Physician (Obstetrics and Gynecology) Rogene Houston, MD as Attending Physician (Gastroenterology)  Indicate any recent Medical Services you may have received from other than [facility] providers in the past year (date may be approximate).     Assessment:   This is a routine wellness examination for Cleva.  Hearing/Vision screen No results found.  Dietary issues and exercise activities discussed: Current Exercise Habits: Home exercise routine, Type of exercise: walking;strength training/weights, Time (Minutes): 30, Frequency (Times/Week): 3, Weekly Exercise (Minutes/Week): 90,  Intensity: Moderate, Exercise limited by: None identified   Goals Addressed   None    Depression Screen PHQ 2/9 Scores 01/27/2021 11/29/2019 05/28/2019  PHQ - 2 Score 0 0 0  PHQ- 9 Score 0 - -    Fall Risk Fall Risk  01/27/2021 11/29/2019 05/28/2019  Falls in the past year? 0 0 0  Number falls in past yr: - 0 0  Injury with Fall? - 0 0  Follow up - Falls evaluation completed;Education provided;Falls prevention discussed Falls evaluation completed    FALL RISK PREVENTION PERTAINING TO THE HOME:  Any stairs in or around the home? No  If so, are there any without handrails?  N/A Home free of loose throw rugs in walkways, pet beds, electrical cords, etc? Yes  Adequate lighting in your home to reduce risk of falls? Yes   ASSISTIVE DEVICES UTILIZED TO PREVENT FALLS:  Life alert? No  Use of a cane, walker or w/c? Yes  Grab bars in the bathroom? No  Shower chair or bench in shower? Yes  Elevated toilet seat or a handicapped toilet? Yes   TIMED UP AND GO:  Was the test performed? Yes .  Length of time to ambulate 10 feet: 8 sec.   Gait steady and fast without use of assistive device  Cognitive Function:     6CIT Screen 01/27/2021 11/29/2019  What Year? 0  points 0 points  What month? 0 points 0 points  What time? 0 points 0 points  Count back from 20 0 points 0 points  Months in reverse 0 points 0 points  Repeat phrase 0 points 0 points  Total Score 0 0    Immunizations Immunization History  Administered Date(s) Administered   Fluad Quad(high Dose 65+) 05/28/2019, 06/10/2020   Pneumococcal Conjugate-13 07/17/2019   Tdap 08/14/2012   Zoster Recombinat (Shingrix) 05/28/2019    TDAP status: Up to date  Flu Vaccine status: Up to date  Pneumococcal vaccine status: Declined,  Education has been provided regarding the importance of this vaccine but patient still declined. Advised may receive this vaccine at local pharmacy or Health Dept. Aware to provide a copy of the vaccination record if obtained from local pharmacy or Health Dept. Verbalized acceptance and understanding.   Covid-19 vaccine status: Declined, Education has been provided regarding the importance of this vaccine but patient still declined. Advised may receive this vaccine at local pharmacy or Health Dept.or vaccine clinic. Aware to provide a copy of the vaccination record if obtained from local pharmacy or Health Dept. Verbalized acceptance and understanding.  Qualifies for Shingles Vaccine? No   Zostavax completed No   Shingrix Completed?: Yes  Screening Tests Health Maintenance  Topic Date Due   COVID-19 Vaccine (1) Never done   Zoster Vaccines- Shingrix (2 of 2) 07/23/2019   PNA vac Low Risk Adult (2 of 2 - PPSV23) 07/16/2020   INFLUENZA VACCINE  03/15/2021   MAMMOGRAM  06/05/2022   TETANUS/TDAP  08/14/2022   COLONOSCOPY (Pts 45-65yrs Insurance coverage will need to be confirmed)  05/29/2024   DEXA SCAN  Completed   Hepatitis C Screening  Completed   HPV VACCINES  Aged Out    Health Maintenance  Health Maintenance Due  Topic Date Due   COVID-19 Vaccine (1) Never done   Zoster Vaccines- Shingrix (2 of 2) 07/23/2019   PNA vac Low Risk Adult (2 of 2 -  PPSV23) 07/16/2020    Colorectal cancer screening: Type of screening: Colonoscopy. Completed 05/2019. Repeat every  5 years  Mammogram status: Completed 05/2020. Repeat every year  Bone Density status: Completed 09/2018. Results reflect: Bone density results: OSTEOPENIA. Repeat every 2 years.  Lung Cancer Screening: (Low Dose CT Chest recommended if Age 39-80 years, 30 pack-year currently smoking OR have quit w/in 15years.) does not qualify.   Lung Cancer Screening Referral: N/A  Additional Screening:  Hepatitis C Screening: does not qualify; Completed 07/2019  Vision Screening: Recommended annual ophthalmology exams for early detection of glaucoma and other disorders of the eye. Is the patient up to date with their annual eye exam?  Yes  Who is the provider or what is the name of the office in which the patient attends annual eye exams? Dr. Rosana Hoes If pt is not established with a provider, would they like to be referred to a provider to establish care? No .   Dental Screening: Recommended annual dental exams for proper oral hygiene  Community Resource Referral / Chronic Care Management: CRR required this visit?  No   CCM required this visit?  No      Plan:     She is not interested in having COVID-19 vaccine administered.  She thinks she got the second Shingrix shot does not positive on the date that it was administered.  She is not interested in having PPSV23 vaccine administered today.  She is up-to-date on mammogram, colon cancer screening, hepatitis C screening, and tetanus shot.  She would be able to have repeat DEXA scan done, but tells me she has tried and not tolerated medication for her osteopenia in the past.  Thus, she will continue to focus on strength training exercises, supplementation of vitamin D, and eating foods high in calcium.  We will hold off on doing DEXA scan for now.  We will recheck CMP today if liver enzymes remain elevated may consider sending for  ultrasound of liver.  She will continue on amlodipine as prescribed.  Blood pressure was elevated in the office today but she tells me when she checks her blood pressure at home it is much better and her systolic blood pressure is usually in the 110s/70s, she has had her blood pressure cuff calibrated recently.  She will continue on omega-3 supplement we will check lipid panel today.  She will continue on her vitamin D3 supplement we will check serum vitamin D level today.  We will check thyroid panel as well today.  I have personally reviewed and noted the following in the patient's chart:   Medical and social history Use of alcohol, tobacco or illicit drugs  Current medications and supplements including opioid prescriptions.  Functional ability and status Nutritional status Physical activity Advanced directives List of other physicians Hospitalizations, surgeries, and ER visits in previous 12 months Vitals Screenings to include cognitive, depression, and falls Referrals and appointments  In addition, I have reviewed and discussed with patient certain preventive protocols, quality metrics, and best practice recommendations. A written personalized care plan for preventive services as well as general preventive health recommendations were provided to patient.    Patient will follow up in 3 to 6 months or sooner as needed.   Ailene Ards, NP   01/27/2021

## 2021-01-28 LAB — LIPID PANEL
Cholesterol: 193 mg/dL (ref <200)
HDL: 80 mg/dL (ref >=50)
LDL Cholesterol (Calc): 94 mg/dL (calc)
Non-HDL Cholesterol (Calc): 113 mg/dL (calc) (ref <130)
Total CHOL/HDL Ratio: 2.4 (calc) (ref <5.0)
Triglycerides: 94 mg/dL (ref <150)

## 2021-01-28 LAB — CBC
HCT: 43 % (ref 35.0–45.0)
Hemoglobin: 14.6 g/dL (ref 11.7–15.5)
MCH: 30.9 pg (ref 27.0–33.0)
MCHC: 34 g/dL (ref 32.0–36.0)
MCV: 91.1 fL (ref 80.0–100.0)
MPV: 9.3 fL (ref 7.5–12.5)
Platelets: 328 10*3/uL (ref 140–400)
RBC: 4.72 10*6/uL (ref 3.80–5.10)
RDW: 12.6 % (ref 11.0–15.0)
WBC: 5.9 10*3/uL (ref 3.8–10.8)

## 2021-01-28 LAB — COMPLETE METABOLIC PANEL WITH GFR
AG Ratio: 2 (calc) (ref 1.0–2.5)
ALT: 17 U/L (ref 6–29)
AST: 32 U/L (ref 10–35)
Albumin: 4.7 g/dL (ref 3.6–5.1)
Alkaline phosphatase (APISO): 106 U/L (ref 37–153)
BUN: 11 mg/dL (ref 7–25)
CO2: 26 mmol/L (ref 20–32)
Calcium: 9.8 mg/dL (ref 8.6–10.4)
Chloride: 101 mmol/L (ref 98–110)
Creat: 0.81 mg/dL (ref 0.50–0.99)
GFR, Est African American: 87 mL/min/{1.73_m2} (ref >=60)
GFR, Est Non African American: 75 mL/min/{1.73_m2} (ref >=60)
Globulin: 2.4 g/dL (calc) (ref 1.9–3.7)
Glucose, Bld: 79 mg/dL (ref 65–99)
Potassium: 3.9 mmol/L (ref 3.5–5.3)
Sodium: 138 mmol/L (ref 135–146)
Total Bilirubin: 0.8 mg/dL (ref 0.2–1.2)
Total Protein: 7.1 g/dL (ref 6.1–8.1)

## 2021-01-28 LAB — TSH: TSH: 0.83 mIU/L (ref 0.40–4.50)

## 2021-01-28 LAB — T3, FREE: T3, Free: 3.2 pg/mL (ref 2.3–4.2)

## 2021-01-28 LAB — VITAMIN D 25 HYDROXY (VIT D DEFICIENCY, FRACTURES): Vit D, 25-Hydroxy: 89 ng/mL (ref 30–100)

## 2021-01-28 LAB — T4, FREE: Free T4: 1 ng/dL (ref 0.8–1.8)

## 2021-02-12 ENCOUNTER — Other Ambulatory Visit (INDEPENDENT_AMBULATORY_CARE_PROVIDER_SITE_OTHER): Payer: Self-pay | Admitting: Internal Medicine

## 2021-02-17 ENCOUNTER — Other Ambulatory Visit: Payer: Self-pay | Admitting: Obstetrics & Gynecology

## 2021-02-17 DIAGNOSIS — Z1231 Encounter for screening mammogram for malignant neoplasm of breast: Secondary | ICD-10-CM

## 2021-03-29 ENCOUNTER — Telehealth (INDEPENDENT_AMBULATORY_CARE_PROVIDER_SITE_OTHER): Payer: Self-pay

## 2021-03-29 DIAGNOSIS — R5383 Other fatigue: Secondary | ICD-10-CM

## 2021-03-29 MED ORDER — THYROID 15 MG PO TABS: 15.0000 mg | ORAL_TABLET | Freq: Every day | ORAL | 3 refills | Status: AC

## 2021-03-29 NOTE — Telephone Encounter (Signed)
Prescription approved and sent to [facility].

## 2021-03-29 NOTE — Telephone Encounter (Signed)
Patient called and stated that she needs a refill for the following medication and if you could add some refills she would greatly appreciate until she can find a new provider. [facility] [place].  NP THYROID 15 MG tablet  Last filled 12/05/2020, # 30 with 3 refills

## 2021-04-15 DIAGNOSIS — C44619 Basal cell carcinoma of skin of left upper limb, including shoulder: Secondary | ICD-10-CM | POA: Diagnosis not present

## 2021-04-15 DIAGNOSIS — L820 Inflamed seborrheic keratosis: Secondary | ICD-10-CM | POA: Diagnosis not present

## 2021-04-15 DIAGNOSIS — D225 Melanocytic nevi of trunk: Secondary | ICD-10-CM | POA: Diagnosis not present

## 2021-04-15 DIAGNOSIS — Z1283 Encounter for screening for malignant neoplasm of skin: Secondary | ICD-10-CM | POA: Diagnosis not present

## 2021-06-07 ENCOUNTER — Ambulatory Visit: Payer: Medicare HMO

## 2021-06-10 ENCOUNTER — Ambulatory Visit
Admission: RE | Admit: 2021-06-10 | Discharge: 2021-06-10 | Disposition: A | Discharge status: Home / Self Care | Payer: Medicare HMO | Source: Ambulatory Visit | Attending: Obstetrics & Gynecology | Admitting: Obstetrics & Gynecology

## 2021-06-10 ENCOUNTER — Other Ambulatory Visit: Payer: Self-pay | Admitting: Obstetrics & Gynecology

## 2021-06-10 ENCOUNTER — Other Ambulatory Visit: Payer: Self-pay

## 2021-06-10 DIAGNOSIS — Z1231 Encounter for screening mammogram for malignant neoplasm of breast: Secondary | ICD-10-CM

## 2021-06-10 DIAGNOSIS — N6489 Other specified disorders of breast: Secondary | ICD-10-CM

## 2021-06-21 ENCOUNTER — Other Ambulatory Visit (HOSPITAL_COMMUNITY): Payer: Self-pay | Admitting: Obstetrics & Gynecology

## 2021-06-21 DIAGNOSIS — Z13820 Encounter for screening for osteoporosis: Secondary | ICD-10-CM

## 2021-06-24 ENCOUNTER — Other Ambulatory Visit: Payer: Self-pay

## 2021-06-24 ENCOUNTER — Ambulatory Visit (HOSPITAL_COMMUNITY)
Admission: RE | Admit: 2021-06-24 | Discharge: 2021-06-24 | Disposition: A | Discharge status: Home / Self Care | Payer: Medicare HMO | Source: Ambulatory Visit | Attending: Obstetrics & Gynecology | Admitting: Obstetrics & Gynecology

## 2021-06-24 DIAGNOSIS — M8589 Other specified disorders of bone density and structure, multiple sites: Secondary | ICD-10-CM | POA: Insufficient documentation

## 2021-06-24 DIAGNOSIS — Z13820 Encounter for screening for osteoporosis: Secondary | ICD-10-CM | POA: Diagnosis not present

## 2021-07-14 ENCOUNTER — Ambulatory Visit
Admission: RE | Admit: 2021-07-14 | Discharge: 2021-07-14 | Disposition: A | Discharge status: Home / Self Care | Payer: Medicare HMO | Source: Ambulatory Visit | Attending: Obstetrics & Gynecology | Admitting: Obstetrics & Gynecology

## 2021-07-14 DIAGNOSIS — N6489 Other specified disorders of breast: Secondary | ICD-10-CM

## 2021-07-14 DIAGNOSIS — R922 Inconclusive mammogram: Secondary | ICD-10-CM | POA: Diagnosis not present

## 2021-07-21 ENCOUNTER — Ambulatory Visit (INDEPENDENT_AMBULATORY_CARE_PROVIDER_SITE_OTHER): Payer: Medicare HMO | Admitting: Nurse Practitioner

## 2021-12-16 DIAGNOSIS — Z0100 Encounter for examination of eyes and vision without abnormal findings: Secondary | ICD-10-CM | POA: Diagnosis not present

## 2021-12-16 DIAGNOSIS — H40013 Open angle with borderline findings, low risk, bilateral: Secondary | ICD-10-CM | POA: Diagnosis not present

## 2021-12-16 DIAGNOSIS — H524 Presbyopia: Secondary | ICD-10-CM | POA: Diagnosis not present

## 2021-12-23 DIAGNOSIS — K589 Irritable bowel syndrome without diarrhea: Secondary | ICD-10-CM | POA: Diagnosis not present

## 2021-12-23 DIAGNOSIS — I10 Essential (primary) hypertension: Secondary | ICD-10-CM | POA: Diagnosis not present

## 2021-12-23 DIAGNOSIS — K5730 Diverticulosis of large intestine without perforation or abscess without bleeding: Secondary | ICD-10-CM | POA: Diagnosis not present

## 2021-12-23 DIAGNOSIS — L989 Disorder of the skin and subcutaneous tissue, unspecified: Secondary | ICD-10-CM | POA: Diagnosis not present

## 2021-12-30 DIAGNOSIS — L820 Inflamed seborrheic keratosis: Secondary | ICD-10-CM | POA: Diagnosis not present

## 2022-02-27 DIAGNOSIS — R040 Epistaxis: Secondary | ICD-10-CM | POA: Diagnosis not present

## 2022-02-27 DIAGNOSIS — R5383 Other fatigue: Secondary | ICD-10-CM | POA: Diagnosis not present

## 2022-03-09 DIAGNOSIS — Z20822 Contact with and (suspected) exposure to covid-19: Secondary | ICD-10-CM | POA: Diagnosis not present

## 2022-03-09 DIAGNOSIS — R070 Pain in throat: Secondary | ICD-10-CM | POA: Diagnosis not present

## 2022-03-09 DIAGNOSIS — H6692 Otitis media, unspecified, left ear: Secondary | ICD-10-CM | POA: Diagnosis not present

## 2022-06-23 DIAGNOSIS — L820 Inflamed seborrheic keratosis: Secondary | ICD-10-CM | POA: Diagnosis not present

## 2022-07-25 DIAGNOSIS — Z1329 Encounter for screening for other suspected endocrine disorder: Secondary | ICD-10-CM | POA: Diagnosis not present

## 2022-07-25 DIAGNOSIS — Z13220 Encounter for screening for lipoid disorders: Secondary | ICD-10-CM | POA: Diagnosis not present

## 2022-07-25 DIAGNOSIS — Z79899 Other long term (current) drug therapy: Secondary | ICD-10-CM | POA: Diagnosis not present

## 2022-07-25 DIAGNOSIS — I10 Essential (primary) hypertension: Secondary | ICD-10-CM | POA: Diagnosis not present

## 2022-07-25 DIAGNOSIS — Z131 Encounter for screening for diabetes mellitus: Secondary | ICD-10-CM | POA: Diagnosis not present

## 2022-07-29 DIAGNOSIS — I10 Essential (primary) hypertension: Secondary | ICD-10-CM | POA: Diagnosis not present

## 2022-08-09 DIAGNOSIS — Z1272 Encounter for screening for malignant neoplasm of vagina: Secondary | ICD-10-CM | POA: Diagnosis not present

## 2023-01-05 DIAGNOSIS — Z79899 Other long term (current) drug therapy: Secondary | ICD-10-CM | POA: Diagnosis not present

## 2023-01-05 DIAGNOSIS — I10 Essential (primary) hypertension: Secondary | ICD-10-CM | POA: Diagnosis not present

## 2023-01-12 DIAGNOSIS — I10 Essential (primary) hypertension: Secondary | ICD-10-CM | POA: Diagnosis not present

## 2023-01-12 DIAGNOSIS — R748 Abnormal levels of other serum enzymes: Secondary | ICD-10-CM | POA: Diagnosis not present

## 2023-03-07 IMAGING — MG DIGITAL DIAGNOSTIC BILAT W/ TOMO W/ CAD
8 series · 9 of 24 positions shown · non-contrast
Comparison: Previous exam(s).

CLINICAL DATA: Follow-up for probably benign RIGHT breast
asymmetry. This probably benign asymmetry was initially described on
screening mammogram dated 06/05/2019.

EXAM:
DIGITAL DIAGNOSTIC BILATERAL MAMMOGRAM WITH TOMOSYNTHESIS AND CAD
TECHNIQUE: Bilateral digital diagnostic mammography and breast tomosynthesis
was performed. The images were evaluated with computer-aided
detection.

[R CC synth-2D]
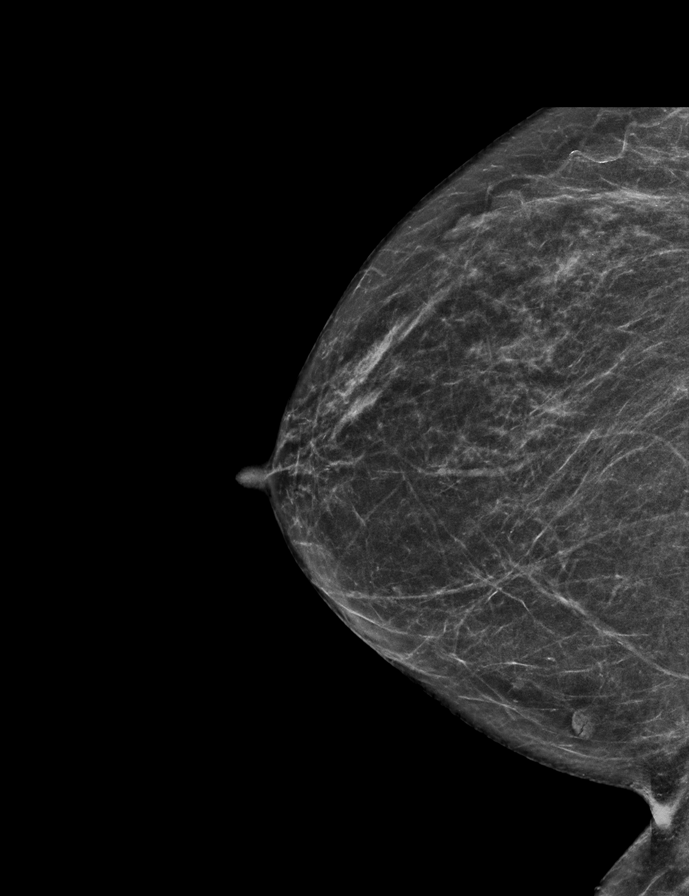

[R MLO synth-2D]
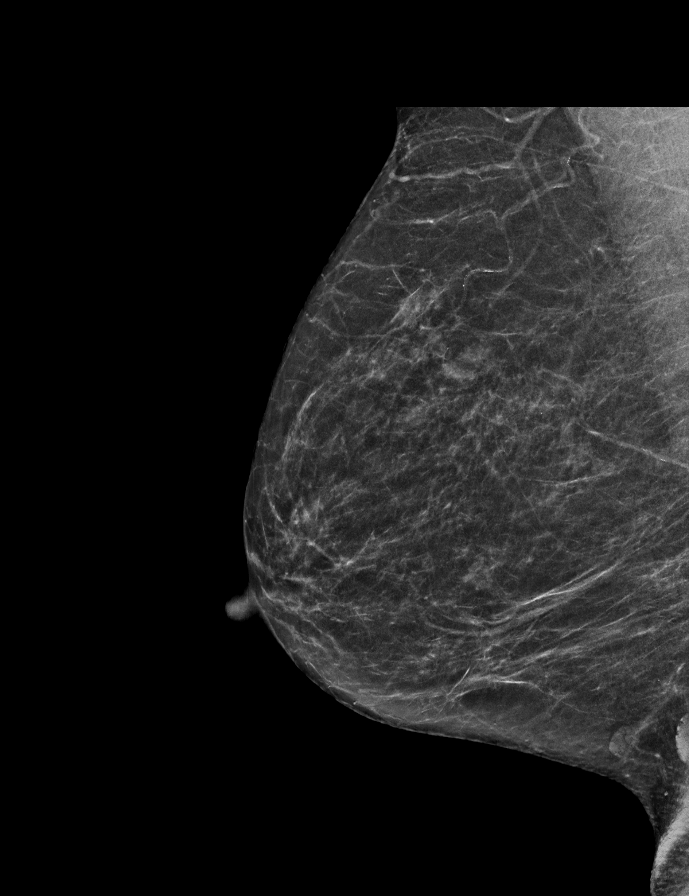

[L CC synth-2D]
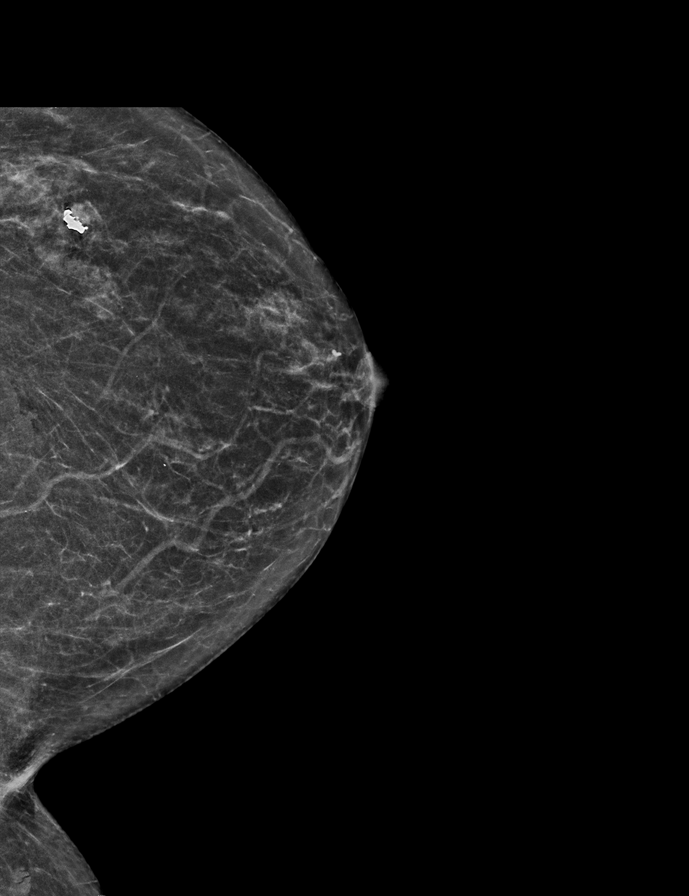

[L MLO synth-2D]
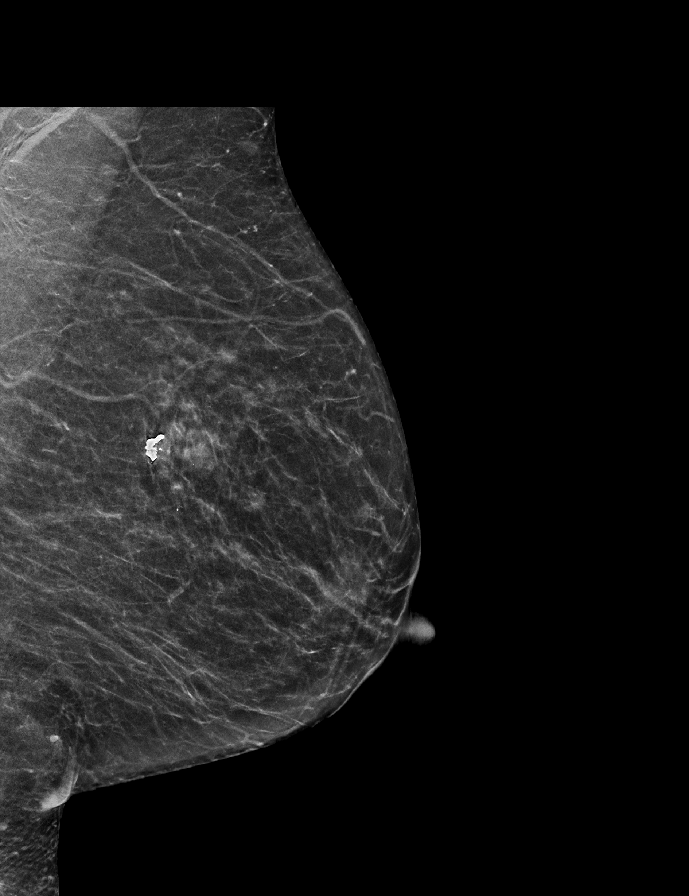

[R MLO tomo · 2 of 54 frames shown]
[frame 18/54]
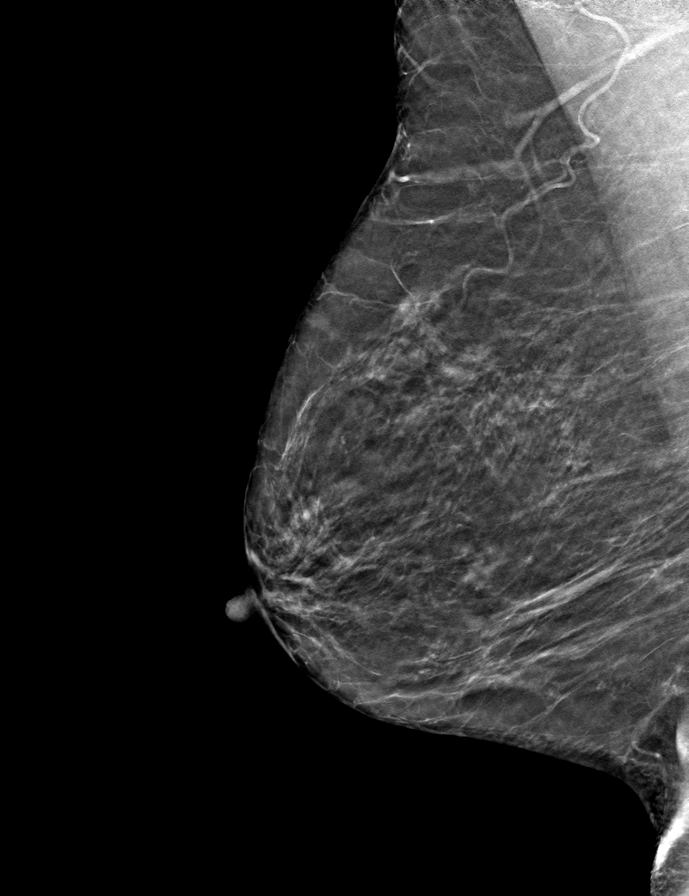
[frame 27/54]
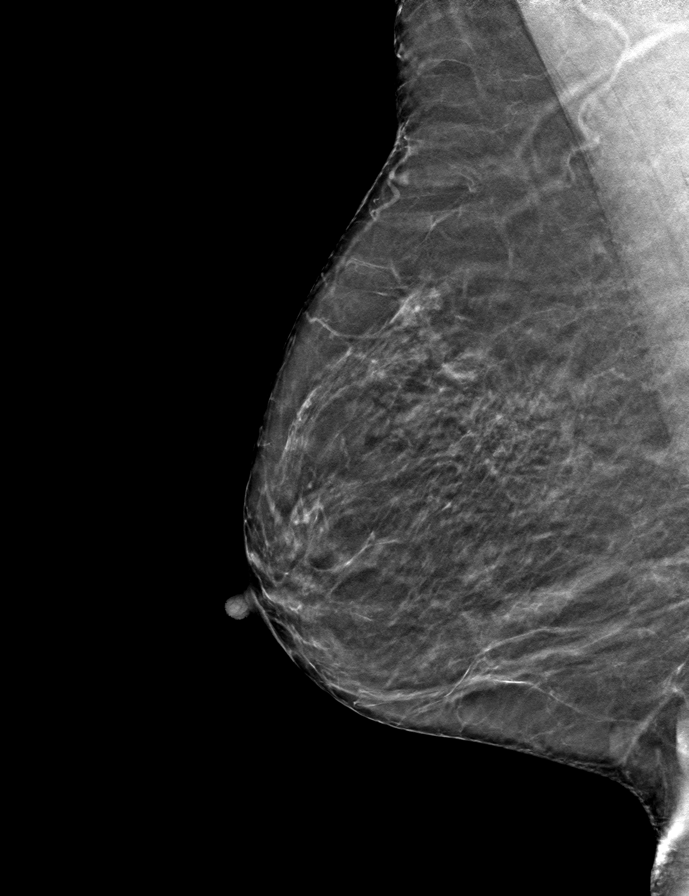

[L CC tomo · tomo slice 25/49.0]
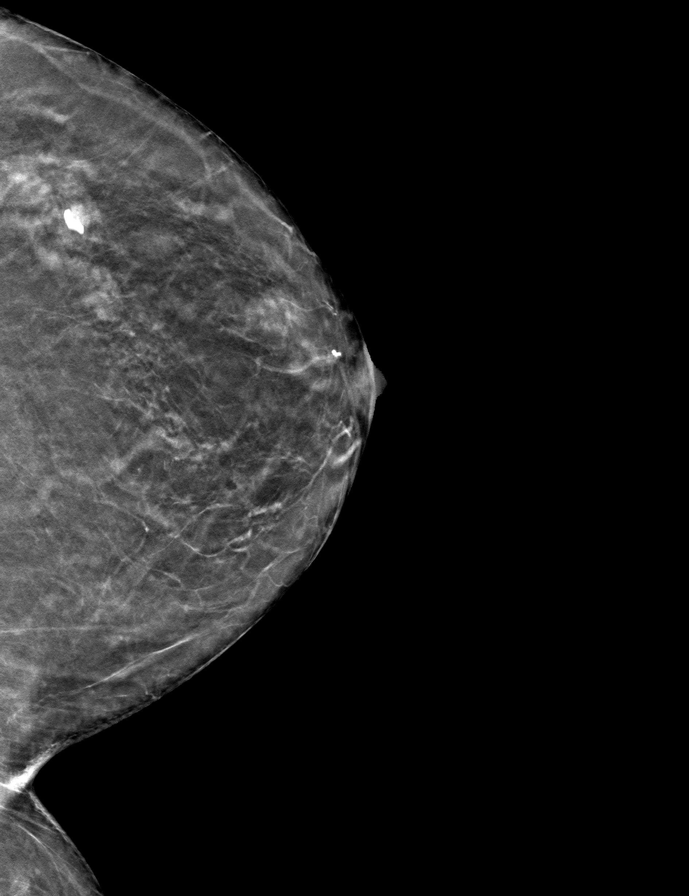

[R CC tomo · tomo slice 27/52.0]
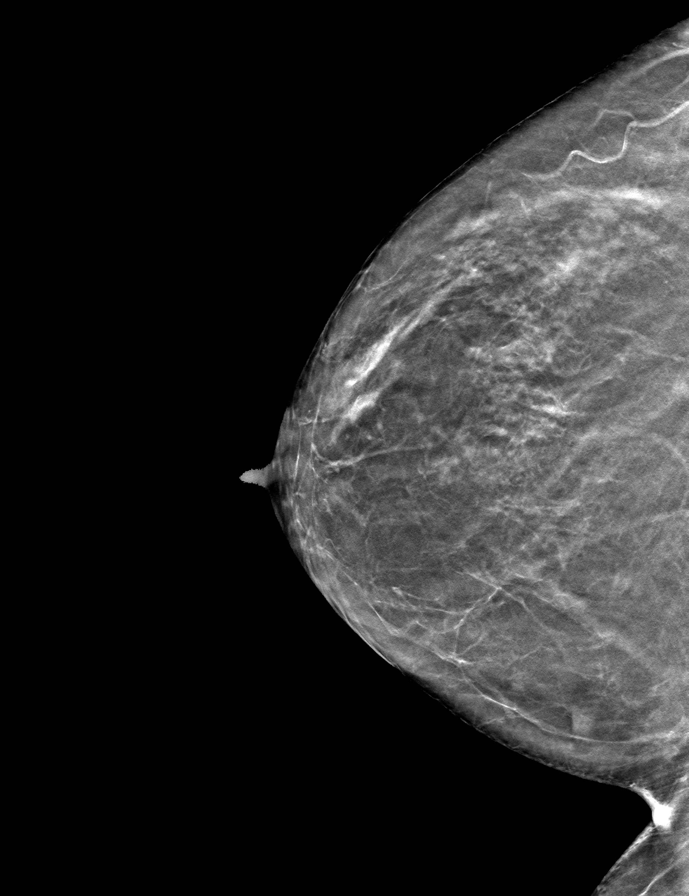

[L MLO tomo · tomo slice 25/50.0]
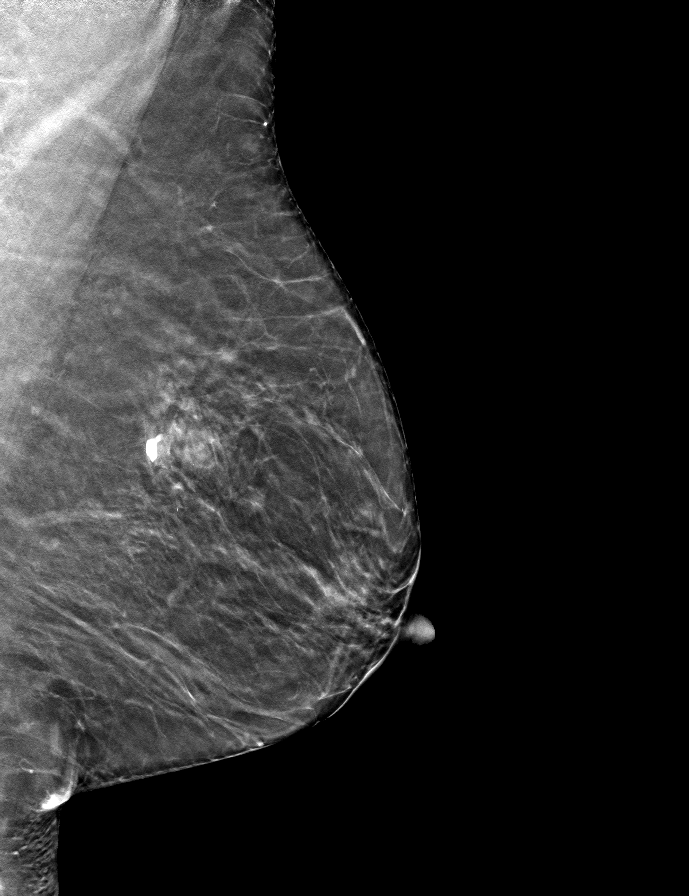

[9 of 24 positions shown; findings below may reference images not displayed]

ACR Breast Density Category b: There are scattered areas of
fibroglandular density.
FINDINGS: The previously described RIGHT breast asymmetry is stable, now shown
to be stable for greater than 2 years confirming benignity. There
are no new dominant masses, suspicious calcifications or secondary
signs of malignancy within either breast.
IMPRESSION: No evidence of malignancy within either breast.

Patient may return to routine annual bilateral screening mammogram
schedule.

RECOMMENDATION:
Screening mammogram in one year.(Code:[id number])

I have discussed the findings and recommendations with the patient.
If applicable, a reminder letter will be sent to the patient
regarding the next appointment.

BI-RADS CATEGORY  2: Benign.

## 2023-04-13 DIAGNOSIS — H40013 Open angle with borderline findings, low risk, bilateral: Secondary | ICD-10-CM | POA: Diagnosis not present

## 2023-06-19 DIAGNOSIS — M25512 Pain in left shoulder: Secondary | ICD-10-CM | POA: Diagnosis not present

## 2023-06-19 DIAGNOSIS — S43432A Superior glenoid labrum lesion of left shoulder, initial encounter: Secondary | ICD-10-CM | POA: Diagnosis not present

## 2023-06-19 DIAGNOSIS — M75102 Unspecified rotator cuff tear or rupture of left shoulder, not specified as traumatic: Secondary | ICD-10-CM | POA: Diagnosis not present

## 2023-09-14 DIAGNOSIS — H524 Presbyopia: Secondary | ICD-10-CM | POA: Diagnosis not present

## 2023-09-14 DIAGNOSIS — H35033 Hypertensive retinopathy, bilateral: Secondary | ICD-10-CM | POA: Diagnosis not present

## 2023-10-11 DIAGNOSIS — M549 Dorsalgia, unspecified: Secondary | ICD-10-CM | POA: Diagnosis not present

## 2023-10-11 DIAGNOSIS — M4804 Spinal stenosis, thoracic region: Secondary | ICD-10-CM | POA: Diagnosis not present

## 2023-10-11 DIAGNOSIS — M5126 Other intervertebral disc displacement, lumbar region: Secondary | ICD-10-CM | POA: Diagnosis not present

## 2023-10-11 DIAGNOSIS — M5134 Other intervertebral disc degeneration, thoracic region: Secondary | ICD-10-CM | POA: Diagnosis not present

## 2023-10-11 DIAGNOSIS — Z9181 History of falling: Secondary | ICD-10-CM | POA: Diagnosis not present

## 2023-10-11 DIAGNOSIS — M545 Low back pain, unspecified: Secondary | ICD-10-CM | POA: Diagnosis not present

## 2023-11-22 DIAGNOSIS — M9901 Segmental and somatic dysfunction of cervical region: Secondary | ICD-10-CM | POA: Diagnosis not present

## 2023-11-22 DIAGNOSIS — M5033 Other cervical disc degeneration, cervicothoracic region: Secondary | ICD-10-CM | POA: Diagnosis not present

## 2023-11-23 DIAGNOSIS — M5033 Other cervical disc degeneration, cervicothoracic region: Secondary | ICD-10-CM | POA: Diagnosis not present

## 2023-11-23 DIAGNOSIS — M9901 Segmental and somatic dysfunction of cervical region: Secondary | ICD-10-CM | POA: Diagnosis not present

## 2023-11-29 DIAGNOSIS — M5033 Other cervical disc degeneration, cervicothoracic region: Secondary | ICD-10-CM | POA: Diagnosis not present

## 2023-11-29 DIAGNOSIS — M9901 Segmental and somatic dysfunction of cervical region: Secondary | ICD-10-CM | POA: Diagnosis not present

## 2023-11-30 DIAGNOSIS — M9901 Segmental and somatic dysfunction of cervical region: Secondary | ICD-10-CM | POA: Diagnosis not present

## 2023-11-30 DIAGNOSIS — M5033 Other cervical disc degeneration, cervicothoracic region: Secondary | ICD-10-CM | POA: Diagnosis not present

## 2023-12-06 DIAGNOSIS — M9901 Segmental and somatic dysfunction of cervical region: Secondary | ICD-10-CM | POA: Diagnosis not present

## 2023-12-06 DIAGNOSIS — M5033 Other cervical disc degeneration, cervicothoracic region: Secondary | ICD-10-CM | POA: Diagnosis not present

## 2023-12-07 DIAGNOSIS — M5033 Other cervical disc degeneration, cervicothoracic region: Secondary | ICD-10-CM | POA: Diagnosis not present

## 2023-12-07 DIAGNOSIS — M9901 Segmental and somatic dysfunction of cervical region: Secondary | ICD-10-CM | POA: Diagnosis not present

## 2023-12-12 DIAGNOSIS — M5033 Other cervical disc degeneration, cervicothoracic region: Secondary | ICD-10-CM | POA: Diagnosis not present

## 2023-12-12 DIAGNOSIS — M9901 Segmental and somatic dysfunction of cervical region: Secondary | ICD-10-CM | POA: Diagnosis not present

## 2023-12-14 DIAGNOSIS — M5033 Other cervical disc degeneration, cervicothoracic region: Secondary | ICD-10-CM | POA: Diagnosis not present

## 2023-12-14 DIAGNOSIS — M9901 Segmental and somatic dysfunction of cervical region: Secondary | ICD-10-CM | POA: Diagnosis not present

## 2023-12-18 DIAGNOSIS — Z809 Family history of malignant neoplasm, unspecified: Secondary | ICD-10-CM | POA: Diagnosis not present

## 2023-12-18 DIAGNOSIS — N182 Chronic kidney disease, stage 2 (mild): Secondary | ICD-10-CM | POA: Diagnosis not present

## 2023-12-18 DIAGNOSIS — Z881 Allergy status to other antibiotic agents status: Secondary | ICD-10-CM | POA: Diagnosis not present

## 2023-12-18 DIAGNOSIS — Z9181 History of falling: Secondary | ICD-10-CM | POA: Diagnosis not present

## 2023-12-18 DIAGNOSIS — M1990 Unspecified osteoarthritis, unspecified site: Secondary | ICD-10-CM | POA: Diagnosis not present

## 2023-12-18 DIAGNOSIS — M4800 Spinal stenosis, site unspecified: Secondary | ICD-10-CM | POA: Diagnosis not present

## 2023-12-18 DIAGNOSIS — I129 Hypertensive chronic kidney disease with stage 1 through stage 4 chronic kidney disease, or unspecified chronic kidney disease: Secondary | ICD-10-CM | POA: Diagnosis not present

## 2023-12-18 DIAGNOSIS — M8580 Other specified disorders of bone density and structure, unspecified site: Secondary | ICD-10-CM | POA: Diagnosis not present

## 2023-12-18 DIAGNOSIS — K589 Irritable bowel syndrome without diarrhea: Secondary | ICD-10-CM | POA: Diagnosis not present

## 2023-12-19 DIAGNOSIS — M5033 Other cervical disc degeneration, cervicothoracic region: Secondary | ICD-10-CM | POA: Diagnosis not present

## 2023-12-19 DIAGNOSIS — M9901 Segmental and somatic dysfunction of cervical region: Secondary | ICD-10-CM | POA: Diagnosis not present

## 2023-12-21 DIAGNOSIS — M9901 Segmental and somatic dysfunction of cervical region: Secondary | ICD-10-CM | POA: Diagnosis not present

## 2023-12-21 DIAGNOSIS — M5033 Other cervical disc degeneration, cervicothoracic region: Secondary | ICD-10-CM | POA: Diagnosis not present

## 2024-04-25 ENCOUNTER — Encounter (INDEPENDENT_AMBULATORY_CARE_PROVIDER_SITE_OTHER): Payer: Self-pay | Admitting: *Deleted

## 2024-06-20 DIAGNOSIS — G8929 Other chronic pain: Secondary | ICD-10-CM | POA: Diagnosis not present

## 2024-06-20 DIAGNOSIS — M25561 Pain in right knee: Secondary | ICD-10-CM | POA: Diagnosis not present

## 2024-06-27 ENCOUNTER — Other Ambulatory Visit: Payer: Self-pay | Admitting: Internal Medicine

## 2024-06-27 DIAGNOSIS — Z1231 Encounter for screening mammogram for malignant neoplasm of breast: Secondary | ICD-10-CM

## 2024-07-01 DIAGNOSIS — E8940 Asymptomatic postprocedural ovarian failure: Secondary | ICD-10-CM | POA: Diagnosis not present

## 2024-07-03 ENCOUNTER — Ambulatory Visit
Admission: RE | Admit: 2024-07-03 | Discharge: 2024-07-03 | Disposition: A | Discharge status: Home / Self Care | Source: Ambulatory Visit | Attending: Nurse Practitioner | Admitting: Nurse Practitioner

## 2024-07-03 DIAGNOSIS — Z1231 Encounter for screening mammogram for malignant neoplasm of breast: Secondary | ICD-10-CM
